# Patient Record
Sex: Male | Born: 1970 | Race: White | Hispanic: Yes | Marital: Married
Health system: Southern US, Community
[De-identification: ages and names within clinical notes are randomized; demographics above are authoritative.]

---

## 2011-03-22 ENCOUNTER — Emergency Department: Payer: Self-pay | Admitting: Emergency Medicine

## 2022-03-02 ENCOUNTER — Other Ambulatory Visit: Payer: Self-pay

## 2022-03-02 ENCOUNTER — Encounter: Payer: Self-pay | Admitting: Emergency Medicine

## 2022-03-02 ENCOUNTER — Emergency Department: Payer: Self-pay

## 2022-03-02 ENCOUNTER — Inpatient Hospital Stay
Admission: EM | Admit: 2022-03-02 | Discharge: 2022-03-05 | DRG: 030 | Disposition: A | Discharge status: Home Health | Payer: Self-pay | Attending: Osteopathic Medicine | Admitting: Osteopathic Medicine

## 2022-03-02 DIAGNOSIS — Z20822 Contact with and (suspected) exposure to covid-19: Secondary | ICD-10-CM | POA: Diagnosis present

## 2022-03-02 DIAGNOSIS — S14125A Central cord syndrome at C5 level of cervical spinal cord, initial encounter: Principal | ICD-10-CM | POA: Diagnosis present

## 2022-03-02 DIAGNOSIS — W1789XA Other fall from one level to another, initial encounter: Secondary | ICD-10-CM | POA: Diagnosis present

## 2022-03-02 DIAGNOSIS — W19XXXA Unspecified fall, initial encounter: Principal | ICD-10-CM

## 2022-03-02 DIAGNOSIS — G9520 Unspecified cord compression: Secondary | ICD-10-CM

## 2022-03-02 DIAGNOSIS — M5020 Other cervical disc displacement, unspecified cervical region: Secondary | ICD-10-CM | POA: Diagnosis present

## 2022-03-02 DIAGNOSIS — S0081XA Abrasion of other part of head, initial encounter: Secondary | ICD-10-CM | POA: Diagnosis present

## 2022-03-02 DIAGNOSIS — M5127 Other intervertebral disc displacement, lumbosacral region: Secondary | ICD-10-CM | POA: Diagnosis present

## 2022-03-02 DIAGNOSIS — M50223 Other cervical disc displacement at C6-C7 level: Secondary | ICD-10-CM | POA: Diagnosis present

## 2022-03-02 DIAGNOSIS — R531 Weakness: Secondary | ICD-10-CM

## 2022-03-02 DIAGNOSIS — M4802 Spinal stenosis, cervical region: Secondary | ICD-10-CM | POA: Diagnosis present

## 2022-03-02 DIAGNOSIS — M50222 Other cervical disc displacement at C5-C6 level: Secondary | ICD-10-CM | POA: Diagnosis present

## 2022-03-02 LAB — CBC
HCT: 39.6 % (ref 39.0–52.0)
Hemoglobin: 13.7 g/dL (ref 13.0–17.0)
MCH: 29.8 pg (ref 26.0–34.0)
MCHC: 34.6 g/dL (ref 30.0–36.0)
MCV: 86.3 fL (ref 80.0–100.0)
Platelets: 244 10*3/uL (ref 150–400)
RBC: 4.59 MIL/uL (ref 4.22–5.81)
RDW: 12.9 % (ref 11.5–15.5)
WBC: 8.4 10*3/uL (ref 4.0–10.5)
nRBC: 0 % (ref 0.0–0.2)

## 2022-03-02 LAB — RESP PANEL BY RT-PCR (FLU A&B, COVID) ARPGX2
Influenza A by PCR: NEGATIVE
Influenza B by PCR: NEGATIVE
SARS Coronavirus 2 by RT PCR: NEGATIVE

## 2022-03-02 LAB — CBG MONITORING, ED: Glucose-Capillary: 105 mg/dL — ABNORMAL HIGH (ref 70–99)

## 2022-03-02 LAB — COMPREHENSIVE METABOLIC PANEL
ALT: 17 U/L (ref 0–44)
AST: 24 U/L (ref 15–41)
Albumin: 4.4 g/dL (ref 3.5–5.0)
Alkaline Phosphatase: 72 U/L (ref 38–126)
Anion gap: 10 (ref 5–15)
BUN: 13 mg/dL (ref 6–20)
CO2: 21 mmol/L — ABNORMAL LOW (ref 22–32)
Calcium: 8.9 mg/dL (ref 8.9–10.3)
Chloride: 106 mmol/L (ref 98–111)
Creatinine, Ser: 0.61 mg/dL (ref 0.61–1.24)
GFR, Estimated: >60 mL/min (ref >60)
Glucose, Bld: 96 mg/dL (ref 70–99)
Potassium: 3.5 mmol/L (ref 3.5–5.1)
Sodium: 137 mmol/L (ref 135–145)
Total Bilirubin: 0.5 mg/dL (ref 0.3–1.2)
Total Protein: 7.8 g/dL (ref 6.5–8.1)

## 2022-03-02 LAB — DIFFERENTIAL
Abs Immature Granulocytes: 0.02 10*3/uL (ref 0.00–0.07)
Basophils Absolute: 0 10*3/uL (ref 0.0–0.1)
Basophils Relative: 0 %
Eosinophils Absolute: 0 10*3/uL (ref 0.0–0.5)
Eosinophils Relative: 0 %
Immature Granulocytes: 0 %
Lymphocytes Relative: 14 %
Lymphs Abs: 1.2 10*3/uL (ref 0.7–4.0)
Monocytes Absolute: 0.4 10*3/uL (ref 0.1–1.0)
Monocytes Relative: 5 %
Neutro Abs: 6.8 10*3/uL (ref 1.7–7.7)
Neutrophils Relative %: 81 %

## 2022-03-02 LAB — PROTIME-INR
INR: 1 (ref 0.8–1.2)
Prothrombin Time: 13.1 seconds (ref 11.4–15.2)

## 2022-03-02 LAB — GLUCOSE, CAPILLARY: Glucose-Capillary: 93 mg/dL (ref 70–99)

## 2022-03-02 LAB — APTT: aPTT: 24 seconds (ref 24–36)

## 2022-03-02 MED ORDER — PANTOPRAZOLE SODIUM 40 MG PO TBEC
40.0000 mg | DELAYED_RELEASE_TABLET | Freq: Every day | ORAL | Status: DC
  Administered 2022-03-04: 40 mg via ORAL
  Filled 2022-03-02: qty 1

## 2022-03-02 MED ORDER — GADOBUTROL 1 MMOL/ML IV SOLN
7.0000 mL | Freq: Once | INTRAVENOUS | Status: AC | PRN
  Administered 2022-03-02: 7.5 mL via INTRAVENOUS

## 2022-03-02 MED ORDER — DOCUSATE SODIUM 100 MG PO CAPS: 100.0000 mg | ORAL_CAPSULE | Freq: Two times a day (BID) | ORAL | Status: DC | PRN

## 2022-03-02 MED ORDER — POLYETHYLENE GLYCOL 3350 17 G PO PACK: 17.0000 g | PACK | Freq: Every day | ORAL | Status: DC | PRN

## 2022-03-02 MED ORDER — DEXAMETHASONE SODIUM PHOSPHATE 10 MG/ML IJ SOLN
10.0000 mg | Freq: Once | INTRAMUSCULAR | Status: DC
Start: 2022-03-02 — End: 2022-03-02

## 2022-03-02 MED ORDER — SODIUM CHLORIDE 0.9 % IV SOLN
Freq: Once | INTRAVENOUS | Status: AC
  Administered 2022-03-02: 100 mL via INTRAVENOUS

## 2022-03-02 MED ORDER — SODIUM CHLORIDE 0.9 % IV SOLN: 250.0000 mL | INTRAVENOUS | Status: DC

## 2022-03-02 MED ORDER — ACETAMINOPHEN 325 MG PO TABS: 650.0000 mg | ORAL_TABLET | ORAL | Status: DC | PRN

## 2022-03-02 MED ORDER — ONDANSETRON HCL 4 MG/2ML IJ SOLN
4.0000 mg | Freq: Once | INTRAMUSCULAR | Status: AC
  Administered 2022-03-02: 4 mg via INTRAVENOUS
  Filled 2022-03-02: qty 2

## 2022-03-02 MED ORDER — MORPHINE SULFATE (PF) 4 MG/ML IV SOLN
4.0000 mg | Freq: Once | INTRAVENOUS | Status: AC
  Administered 2022-03-02: 4 mg via INTRAVENOUS
  Filled 2022-03-02: qty 1

## 2022-03-02 MED ORDER — NOREPINEPHRINE 4 MG/250ML-% IV SOLN: 2.0000 ug/min | INTRAVENOUS | Status: DC
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
## 2022-03-02 NOTE — ED Notes (Signed)
Pt was transported to MRI 
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
## 2022-03-03 ENCOUNTER — Inpatient Hospital Stay: Payer: Self-pay

## 2022-03-03 ENCOUNTER — Inpatient Hospital Stay: Payer: Self-pay | Admitting: Certified Registered"

## 2022-03-03 ENCOUNTER — Encounter
Admission: EM | Disposition: A | Discharge status: Home Health | Payer: Self-pay | Source: Home / Self Care | Attending: Internal Medicine

## 2022-03-03 ENCOUNTER — Other Ambulatory Visit: Payer: Self-pay

## 2022-03-03 HISTORY — PX: ANTERIOR CERVICAL DECOMP/DISCECTOMY FUSION: SHX1161

## 2022-03-03 LAB — CBC
HCT: 38.3 % — ABNORMAL LOW (ref 39.0–52.0)
Hemoglobin: 13 g/dL (ref 13.0–17.0)
MCH: 30 pg (ref 26.0–34.0)
MCHC: 33.9 g/dL (ref 30.0–36.0)
MCV: 88.2 fL (ref 80.0–100.0)
Platelets: 225 10*3/uL (ref 150–400)
RBC: 4.34 MIL/uL (ref 4.22–5.81)
RDW: 12.9 % (ref 11.5–15.5)
WBC: 7.9 10*3/uL (ref 4.0–10.5)
nRBC: 0 % (ref 0.0–0.2)

## 2022-03-03 LAB — GLUCOSE, CAPILLARY
Glucose-Capillary: 76 mg/dL (ref 70–99)
Glucose-Capillary: 82 mg/dL (ref 70–99)
Glucose-Capillary: 79 mg/dL (ref 70–99)
Glucose-Capillary: 78 mg/dL (ref 70–99)
Glucose-Capillary: 99 mg/dL (ref 70–99)
Glucose-Capillary: 116 mg/dL — ABNORMAL HIGH (ref 70–99)
Glucose-Capillary: 145 mg/dL — ABNORMAL HIGH (ref 70–99)

## 2022-03-03 LAB — BASIC METABOLIC PANEL
Anion gap: 12 (ref 5–15)
BUN: 17 mg/dL (ref 6–20)
CO2: 24 mmol/L (ref 22–32)
Calcium: 8.8 mg/dL — ABNORMAL LOW (ref 8.9–10.3)
Chloride: 105 mmol/L (ref 98–111)
Creatinine, Ser: 0.78 mg/dL (ref 0.61–1.24)
GFR, Estimated: >60 mL/min (ref >60)
Glucose, Bld: 82 mg/dL (ref 70–99)
Potassium: 3.3 mmol/L — ABNORMAL LOW (ref 3.5–5.1)
Sodium: 141 mmol/L (ref 135–145)

## 2022-03-03 LAB — PHOSPHORUS: Phosphorus: 3 mg/dL (ref 2.5–4.6)

## 2022-03-03 LAB — MAGNESIUM: Magnesium: 2.2 mg/dL (ref 1.7–2.4)

## 2022-03-03 LAB — TYPE AND SCREEN
ABO/RH(D): B POS
Antibody Screen: NEGATIVE

## 2022-03-03 LAB — ABO/RH: ABO/RH(D): B POS

## 2022-03-03 LAB — HIV ANTIBODY (ROUTINE TESTING W REFLEX): HIV Screen 4th Generation wRfx: NONREACTIVE

## 2022-03-03 SURGERY — ANTERIOR CERVICAL DECOMPRESSION/DISCECTOMY FUSION 2 LEVELS
Anesthesia: General | Site: Spine Cervical | Laterality: Left

## 2022-03-03 MED ORDER — PROPOFOL 500 MG/50ML IV EMUL
INTRAVENOUS | Status: DC | PRN
  Administered 2022-03-03: 150 ug/kg/min via INTRAVENOUS

## 2022-03-03 MED ORDER — REMIFENTANIL HCL 1 MG IV SOLR
INTRAVENOUS | Status: AC
  Filled 2022-03-03: qty 1000

## 2022-03-03 MED ORDER — LIDOCAINE-EPINEPHRINE 1 %-1:100000 IJ SOLN
INTRAMUSCULAR | Status: DC | PRN
  Administered 2022-03-03: 7 mL

## 2022-03-03 MED ORDER — MIDAZOLAM HCL 2 MG/2ML IJ SOLN
INTRAMUSCULAR | Status: DC | PRN
Start: 2022-03-03 — End: 2022-03-03
  Administered 2022-03-03: 2 mg via INTRAVENOUS

## 2022-03-03 MED ORDER — ONDANSETRON HCL 4 MG/2ML IJ SOLN
INTRAMUSCULAR | Status: DC | PRN
  Administered 2022-03-03: 4 mg via INTRAVENOUS

## 2022-03-03 MED ORDER — LACTATED RINGERS IV SOLN: INTRAVENOUS | Status: DC | PRN

## 2022-03-03 MED ORDER — SUCCINYLCHOLINE CHLORIDE 200 MG/10ML IV SOSY
PREFILLED_SYRINGE | INTRAVENOUS | Status: DC | PRN
  Administered 2022-03-03: 100 mg via INTRAVENOUS

## 2022-03-03 MED ORDER — POTASSIUM CHLORIDE 10 MEQ/100ML IV SOLN
10.0000 meq | INTRAVENOUS | Status: AC
  Administered 2022-03-03 (×4): 10 meq via INTRAVENOUS
  Filled 2022-03-03 (×4): qty 100

## 2022-03-03 MED ORDER — HYDROMORPHONE HCL 1 MG/ML IJ SOLN: 0.2500 mg | INTRAMUSCULAR | Status: DC | PRN

## 2022-03-03 MED ORDER — GABAPENTIN 300 MG PO CAPS
ORAL_CAPSULE | ORAL | Status: AC
  Administered 2022-03-03: 300 mg
  Filled 2022-03-03: qty 1

## 2022-03-03 MED ORDER — GABAPENTIN 600 MG PO TABS
300.0000 mg | ORAL_TABLET | Freq: Once | ORAL | Status: DC
Start: 2022-03-03 — End: 2022-03-03
  Filled 2022-03-03: qty 0.5

## 2022-03-03 MED ORDER — PROPOFOL 1000 MG/100ML IV EMUL
INTRAVENOUS | Status: AC
  Filled 2022-03-03: qty 100

## 2022-03-03 MED ORDER — PROMETHAZINE HCL 25 MG/ML IJ SOLN: 6.2500 mg | INTRAMUSCULAR | Status: DC | PRN

## 2022-03-03 MED ORDER — DEXAMETHASONE SODIUM PHOSPHATE 10 MG/ML IJ SOLN
INTRAMUSCULAR | Status: DC | PRN
  Administered 2022-03-03: 10 mg via INTRAVENOUS

## 2022-03-03 MED ORDER — MORPHINE SULFATE (PF) 2 MG/ML IV SOLN
INTRAVENOUS | Status: AC
  Administered 2022-03-03: 2 mg via INTRAVENOUS
  Filled 2022-03-03: qty 1

## 2022-03-03 MED ORDER — MORPHINE SULFATE (PF) 2 MG/ML IV SOLN: 2.0000 mg | INTRAVENOUS | Status: DC | PRN

## 2022-03-03 MED ORDER — FENTANYL CITRATE (PF) 100 MCG/2ML IJ SOLN
INTRAMUSCULAR | Status: AC
  Filled 2022-03-03: qty 2

## 2022-03-03 MED ORDER — PHENYLEPHRINE HCL-NACL 20-0.9 MG/250ML-% IV SOLN
INTRAVENOUS | Status: DC | PRN
  Administered 2022-03-03: 30 ug/min via INTRAVENOUS

## 2022-03-03 MED ORDER — FENTANYL CITRATE (PF) 100 MCG/2ML IJ SOLN
INTRAMUSCULAR | Status: DC | PRN
  Administered 2022-03-03 (×2): 25 ug via INTRAVENOUS
  Administered 2022-03-03: 50 ug via INTRAVENOUS

## 2022-03-03 MED ORDER — ACETAMINOPHEN 500 MG PO TABS: 1000.0000 mg | ORAL_TABLET | Freq: Once | ORAL | Status: AC

## 2022-03-03 MED ORDER — MIDAZOLAM HCL 2 MG/2ML IJ SOLN
INTRAMUSCULAR | Status: AC
  Filled 2022-03-03: qty 2

## 2022-03-03 MED ORDER — POTASSIUM CHLORIDE CRYS ER 20 MEQ PO TBCR: 40.0000 meq | EXTENDED_RELEASE_TABLET | Freq: Once | ORAL | Status: DC

## 2022-03-03 MED ORDER — 0.9 % SODIUM CHLORIDE (POUR BTL) OPTIME
TOPICAL | Status: DC | PRN
  Administered 2022-03-03: 1000 mL

## 2022-03-03 MED ORDER — KETOROLAC TROMETHAMINE 30 MG/ML IJ SOLN
INTRAMUSCULAR | Status: AC
  Filled 2022-03-03: qty 1

## 2022-03-03 MED ORDER — SURGIFLO WITH THROMBIN (HEMOSTATIC MATRIX KIT) OPTIME
TOPICAL | Status: DC | PRN
  Administered 2022-03-03: 1 via TOPICAL

## 2022-03-03 MED ORDER — PROPOFOL 500 MG/50ML IV EMUL
INTRAVENOUS | Status: AC
  Filled 2022-03-03: qty 50

## 2022-03-03 MED ORDER — ACETAMINOPHEN 500 MG PO TABS
ORAL_TABLET | ORAL | Status: AC
  Administered 2022-03-03: 1000 mg via ORAL
  Filled 2022-03-03: qty 2

## 2022-03-03 MED ORDER — PROPOFOL 10 MG/ML IV BOLUS
INTRAVENOUS | Status: AC
  Filled 2022-03-03: qty 20

## 2022-03-03 MED ORDER — KETOROLAC TROMETHAMINE 30 MG/ML IJ SOLN
INTRAMUSCULAR | Status: DC | PRN
  Administered 2022-03-03: 30 mg via INTRAVENOUS

## 2022-03-03 MED ORDER — SODIUM CHLORIDE 0.9 % IV SOLN: INTRAVENOUS | Status: AC

## 2022-03-03 MED ORDER — CEFAZOLIN SODIUM-DEXTROSE 2-3 GM-%(50ML) IV SOLR
INTRAVENOUS | Status: DC | PRN
Start: 2022-03-03 — End: 2022-03-03
  Administered 2022-03-03: 2 g via INTRAVENOUS

## 2022-03-03 MED ORDER — PROPOFOL 10 MG/ML IV BOLUS
INTRAVENOUS | Status: DC | PRN
  Administered 2022-03-03: 150 mg via INTRAVENOUS
  Administered 2022-03-03: 50 mg via INTRAVENOUS

## 2022-03-03 MED ORDER — FENTANYL CITRATE (PF) 100 MCG/2ML IJ SOLN
INTRAMUSCULAR | Status: AC
  Administered 2022-03-03: 50 ug
  Filled 2022-03-03: qty 2

## 2022-03-03 MED ORDER — LIDOCAINE HCL (CARDIAC) PF 100 MG/5ML IV SOSY
PREFILLED_SYRINGE | INTRAVENOUS | Status: DC | PRN
  Administered 2022-03-03: 100 mg via INTRAVENOUS

## 2022-03-03 MED ORDER — REMIFENTANIL HCL 1 MG IV SOLR
INTRAVENOUS | Status: DC | PRN
  Administered 2022-03-03: .1 ug/kg/min via INTRAVENOUS

## 2022-03-03 MED ORDER — DROPERIDOL 2.5 MG/ML IJ SOLN: 0.6250 mg | Freq: Once | INTRAMUSCULAR | Status: DC | PRN

## 2022-03-03 SURGICAL SUPPLY — 70 items
BIT DRILL 13 (BIT) IMPLANT
BIT DRILL SPINE QC 12 (BIT) ×1 IMPLANT
BLADE BOVIE TIP EXT 4 (BLADE) ×2 IMPLANT
BLADE SURG 15 STRL LF DISP TIS (BLADE) ×1 IMPLANT
BLADE SURG 15 STRL SS (BLADE) ×1
BUR DIAMOND COARSE 4.0 RND (BURR) IMPLANT
BUR NEURO DRILL SOFT 3.0X3.8M (BURR) ×2 IMPLANT
CHLORAPREP W/TINT 26 (MISCELLANEOUS) ×4 IMPLANT
COUNTER NEEDLE 20/40 LG (NEEDLE) ×2 IMPLANT
COVER LIGHT HANDLE STERIS (MISCELLANEOUS) ×4 IMPLANT
CUP MEDICINE 2OZ PLAST GRAD ST (MISCELLANEOUS) ×4 IMPLANT
DERMABOND ADVANCED (GAUZE/BANDAGES/DRESSINGS) ×1
DERMABOND ADVANCED .7 DNX12 (GAUZE/BANDAGES/DRESSINGS) ×1 IMPLANT
DRAPE C-ARM 42X72 X-RAY (DRAPES) ×4 IMPLANT
DRAPE C-ARM XRAY 36X54 (DRAPES) ×1 IMPLANT
DRAPE MICROSCOPE SPINE 48X150 (DRAPES) ×2 IMPLANT
DRAPE THYROID T SHEET (DRAPES) ×2 IMPLANT
ELECT CAUTERY BLADE TIP 2.5 (TIP) ×2
ELECT EZSTD 165MM 6.5IN (MISCELLANEOUS) ×2
ELECTRODE CAUTERY BLDE TIP 2.5 (TIP) ×1 IMPLANT
ELECTRODE EZSTD 165MM 6.5IN (MISCELLANEOUS) ×1 IMPLANT
FEE INTRAOP CADWELL SUPPLY NCS (MISCELLANEOUS) ×1 IMPLANT
FEE INTRAOP MONITOR IMPULS NCS (MISCELLANEOUS) IMPLANT
GAUZE 4X4 16PLY ~~LOC~~+RFID DBL (SPONGE) ×2 IMPLANT
GAUZE SPONGE 4X4 12PLY STRL (GAUZE/BANDAGES/DRESSINGS) ×2 IMPLANT
GLOVE BIOGEL PI IND STRL 6.5 (GLOVE) ×2 IMPLANT
GLOVE BIOGEL PI IND STRL 8 (GLOVE) ×1 IMPLANT
GLOVE BIOGEL PI INDICATOR 6.5 (GLOVE) ×2
GLOVE BIOGEL PI INDICATOR 8 (GLOVE) ×1
GLOVE SURG SYN 6.5 ES PF (GLOVE) ×4 IMPLANT
GLOVE SURG SYN 6.5 PF PI (GLOVE) ×2 IMPLANT
GLOVE SURG SYN 8.0 (GLOVE) ×4 IMPLANT
GLOVE SURG SYN 8.0 PF PI (GLOVE) ×2 IMPLANT
GOWN SRG LRG LVL 4 IMPRV REINF (GOWNS) ×2 IMPLANT
GOWN STRL REIN LRG LVL4 (GOWNS) ×2
GOWN STRL REUS W/ TWL XL LVL3 (GOWN DISPOSABLE) ×2 IMPLANT
GOWN STRL REUS W/TWL XL LVL3 (GOWN DISPOSABLE) ×2
GRADUATE 1200CC STRL 31836 (MISCELLANEOUS) ×2 IMPLANT
INTRAOP CADWELL SUPPLY FEE NCS (MISCELLANEOUS) ×1
INTRAOP DISP SUPPLY FEE NCS (MISCELLANEOUS) ×1
INTRAOP MONITOR FEE IMPULS NCS (MISCELLANEOUS)
INTRAOP MONITOR FEE IMPULSE (MISCELLANEOUS)
KIT TURNOVER KIT A (KITS) ×2 IMPLANT
MANIFOLD NEPTUNE II (INSTRUMENTS) ×2 IMPLANT
MARKER SKIN DUAL TIP RULER LAB (MISCELLANEOUS) ×4 IMPLANT
NDL SPNL 22GX3.5 QUINCKE BK (NEEDLE) ×1 IMPLANT
NEEDLE HYPO 22GX1.5 SAFETY (NEEDLE) ×2 IMPLANT
NEEDLE SPNL 22GX3.5 QUINCKE BK (NEEDLE) ×2 IMPLANT
NS IRRIG 1000ML POUR BTL (IV SOLUTION) ×2 IMPLANT
PACK LAMINECTOMY NEURO (CUSTOM PROCEDURE TRAY) ×2 IMPLANT
PAD ARMBOARD 7.5X6 YLW CONV (MISCELLANEOUS) ×4 IMPLANT
PIN CASPAR 14 (PIN) ×1 IMPLANT
PIN CASPAR 14MM (PIN) ×2 IMPLANT
PLATE ANT CERV XTEND 2 LV 30 (Plate) ×1 IMPLANT
SCREW VAR 4.2 XD SELF DRILL 16 (Screw) ×4 IMPLANT
SCREW XTEND SELF DRILL 4.6X16 (Screw) ×2 IMPLANT
SPACER CERV FRGE 12X14X9-7 (Spacer) ×1 IMPLANT
SPACER CERVICAL FRGE 12X14X7-7 (Spacer) ×1 IMPLANT
SPACER CERVICAL FRGE 12X14X8-0 (Spacer) ×1 IMPLANT
SPONGE KITTNER 5P (MISCELLANEOUS) ×2 IMPLANT
SURGIFLO W/THROMBIN 8M KIT (HEMOSTASIS) ×2 IMPLANT
SUT POLYSORB 2-0 5X18 GS-10 (SUTURE) ×4 IMPLANT
SUT VICRYL 3-0 CR8 SH (SUTURE) ×2 IMPLANT
SYR 20ML LL LF (SYRINGE) ×1 IMPLANT
SYR 30ML LL (SYRINGE) ×2 IMPLANT
TAPE CLOTH 3X10 WHT NS LF (GAUZE/BANDAGES/DRESSINGS) ×2 IMPLANT
TOWEL OR 17X26 4PK STRL BLUE (TOWEL DISPOSABLE) ×4 IMPLANT
TRAY FOLEY MTR SLVR 16FR STAT (SET/KITS/TRAYS/PACK) IMPLANT
TUBING CONNECTING 10 (TUBING) ×2 IMPLANT
WATER STERILE IRR 500ML POUR (IV SOLUTION) ×2 IMPLANT
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
## 2022-03-03 NOTE — Transfer of Care (Signed)
Immediate Anesthesia Transfer of Care Note ? ?Patient: Timothy Wright ? ?Procedure(s) Performed: ANTERIOR CERVICAL DECOMPRESSION/DISCECTOMY FUSION 2 LEVELS (Left: Spine Cervical) ? ?Patient Location: PACU ? ?Anesthesia Type:General ? ?Level of Consciousness: awake, alert , oriented, patient cooperative and responds to stimulation ? ?Airway & Oxygen Therapy: Patient Spontanous Breathing and Patient connected to nasal cannula oxygen ? ?Post-op Assessment: Report given to RN, Post -op Vital signs reviewed and stable and Patient moving all extremities X 4 ? ?Post vital signs: stable ? ?Last Vitals:  ?Vitals Value Taken Time  ?BP 152/89 03/03/22 1706  ?Temp 98.25F   ?Pulse 96 03/03/22 1712  ?Resp 17 03/03/22 1712  ?SpO2 100 % 03/03/22 1712  ?Vitals shown include unvalidated device data. ? ?Last Pain:  ?Vitals:  ? 03/03/22 0800  ?TempSrc: Oral  ?PainSc: 0-No pain  ?   ? ?  ? ?Complications: No notable events documented. ?

## 2022-03-03 NOTE — Anesthesia Procedure Notes (Signed)
Arterial Line Insertion ?Start/End5/14/2023 2:07 PM, 03/03/2022 2:07 PM ?Performed by: Foye Deer, MD, anesthesiologist ? Patient location: OR. ?Preanesthetic checklist: patient identified, IV checked, site marked, risks and benefits discussed, surgical consent, monitors and equipment checked, pre-op evaluation, timeout performed and anesthesia consent ?Lidocaine 1% used for infiltration ?Right, radial was placed ?Catheter size: 20 G ?Hand hygiene performed  and maximum sterile barriers used  ? ?Attempts: 2 ?Procedure performed using ultrasound guided technique. ?Ultrasound Notes:anatomy identified and no ultrasound evidence of intravascular and/or intraneural injection ?Following insertion, dressing applied. ?Post procedure assessment: normal and unchanged ? ?Patient tolerated the procedure well with no immediate complications. ? ? ?

## 2022-03-03 NOTE — Anesthesia Preprocedure Evaluation (Addendum)
Anesthesia Evaluation  ?Patient identified by MRN, date of birth, ID band ?Patient awake ? ? ? ?Reviewed: ?Allergy & Precautions, NPO status , Patient's Chart, lab work & pertinent test results ? ?Airway ?Mallampati: III ? ?TM Distance: >3 FB ?Neck ROM: full ? ? ?Comment: Cervical collar in place Dental ? ?(+) Chipped ?  ?Pulmonary ?neg pulmonary ROS,  ?  ?Pulmonary exam normal ? ? ? ? ? ? ? Cardiovascular ?negative cardio ROS ?Normal cardiovascular exam ? ? ?  ?Neuro/Psych ?negative psych ROS  ? GI/Hepatic ?negative GI ROS, (+)  ?  ? substance abuse (6.0 standard drinks per week) ? alcohol use,   ?Endo/Other  ?negative endocrine ROS ? Renal/GU ?  ? ?  ?Musculoskeletal ? ? Abdominal ?Normal abdominal exam  (+)   ?Peds ? 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 ?S/p fall off a truck and striking his face with right arm and leg weakness. ? ?Admitted to ICU with strict BP control maintaining MAP > 85 using vasopressors PRN. ? ?Medications given: morphine IV, zofran, IV contrast ?Initial Vitals: 99.8, 18, 96, 138/89 & 94% on RA ?EKG Interpretation: Date:?03/02/22, EKG?Time:?10:19, Rate:?95, Rhythm: NSR, QRS Axis:? normal, Intervals: normal, ST/T Wave abnormalities: none, Narrative Interpretation: NSR ?Blood work WNL except serum CO2 which is slightly acidotic at 21 ?? ?CT head wo contrast 03/02/22: no acute intracranial abnormality ?CT Maxillofacial & cervical spine 03/02/22: Maxillofacial CT: Negative. ?No fracture. CERVICAL CT: No fracture or acute finding. ?MRI cervical spine w wo contrast 03/02/22:?Broad-based central disc protrusion at C5-6 with resultant severe spinal stenosis and cord flattening. Associated cord signal changes consistent with acute cord injury/contusion. Mild edema within the interspinous region of C4-5 through C6-7, suspected to reflect mild ligamentous injury/strain. Mild soft tissue  injury/muscular strain within the suboccipital soft tissues. Additional underlying mild multilevel cervical spondylosis as ?above. ?MRI lumbar spine w wo contrast 03/02/22: No acute abnormality within the lumbar spine. Small left subarticular disc protrusion at L5-S1, contacting and potentially irritating the descending left S1 nerve root. Mild disc bulge with facet hypertrophy at L4-5 with resultant mild bilateral lateral recess and foraminal stenosis. Prominent distension of the visualized urinary bladder. Clinical correlation for possible urinary retention recommended.  ?MRI thoracic spine w wo contrast 03/02/22: No acute traumatic injury or other abnormality identified within the thoracic spine or spinal cord. Small central disc protrusions at T2-3 and T3-4 with minimal cord ?flattening, but no significant stenosis.  ? ?BMI   ? Body Mass Index: 30.24 kg/m?  ?  ? ? Reproductive/Obstetrics ?negative OB ROS ? ?  ? ? ? ? ? ? ? ? ? ? ? ? ? ?  ?  ? ? ? ? ? ? ?Anesthesia Physical ?Anesthesia Plan ? ?ASA: 2 ? ?Anesthesia Plan: General ETT  ? ?Post-op Pain Management: Tylenol PO (pre-op)*, Gabapentin PO (pre-op)* and Toradol IV (intra-op)*  ? ?Induction: Intravenous ? ?PONV Risk Score and Plan: 2 and Ondansetron, Dexamethasone, Midazolam and Treatment may vary due to age or medical condition ? ?Airway Management Planned: Oral ETT ? ?Additional Equipment:  ? ?Intra-op Plan:  ? ?Post-operative Plan: Extubation in OR ? ?Informed Consent: I have reviewed the patients History and Physical, chart, labs and discussed the procedure including the risks, benefits and alternatives for the proposed anesthesia with the patient or authorized representative who has indicated his/her understanding and acceptance.  ? ? ? ?Dental Advisory Given ? ?Plan Discussed with: Anesthesiologist, CRNA and Surgeon ? ?  Anesthesia Plan Comments:   ? ? ? ?Anesthesia Quick Evaluation ? ?

## 2022-03-03 NOTE — Op Note (Signed)
Operative Note ?  ?SURGERY DATE:  03/03/2022 ?  ?PRE-OP DIAGNOSIS:  Cervical Myelopathy ?  ?POST-OP DIAGNOSIS: Post-Op Diagnosis Codes: ?Cervical Myelopathy ?  ?Procedure(s) with comments: ?C4/5, C5/6 Anterior Cervical Discectomy and Fusion ? ?SURGEON:  ?   Nathaniel Man, MD  ?   ?  ?ANESTHESIA: General  ?  ?OPERATIVE FINDINGS: C4/5, C5/6 Stenosis ?  ?Procedure ?Indications ?Timothy Wright presented to the ED with weakness in hands and feet after a fall.  He had a MRI that showed disc herniations causing central stenosis at C4/5 and C5/6 and cord compression. Given this, we recommended an anterior cervical decompression and fusion to relieve the pressure on the spinal cord. The risks of hematoma, infection, poor bone healing and failure of fusion, cord injury, weakness, numbness, neck pain, stroke, and death were discussed in detail. All questions were answered and the patient elected to proceed with the surgery.  ?   ?Procedure ?After obtaining informed consent, the patient was taken to the Operating Room where general anesthesia was induced and the patient intubated. Vascular access was obtained. Foley catheter was placed. Decadron was administered. The head was slightly extended and imaging used to identify a skin crease overlying the C5 vertebral body.  ?   ?The patient was prepped and draped in the usual sterile fashion and a timeout was performed per protocol. Local anesthesia was instilled with epinephrine along the planned incision site. A transverse cervical incision was performed on the left in a skin crease. The incision was carried to the level of the platysma and then cautery was used to incise the muscle. Blunt dissection was used to expand the plane and the dissection was carried deep medial to the SCM and carotid sheath being careful to identify the trachea and esophagus medially. The prevertebral fascia was identified and this was bluntly dissected to expose the disc spaces. A needle was  placed in the disc space and x-ray confirmed the C5/6 disc level.  ?   ?Next, cautery was used to undermine the longus colli muscles bilaterally and identify the C4/5 and C5/6 disc spaces. Caspar pins were placed at C5 and C6 and a retractor system placed under the muscles to complete the exposure. Next, a combination of curettes and Kerrison rongeurs were used to remove the anterior osteophyte at C5/6 and then the disc material. A 51mm matchstick was used to shave the endplates of the adjacent bodies. A trial spacer was used to size the graft and then hemostasis obtained. The microscope was brought into the field for the remainder of the surgery. The drill was used to remove the osteophyte/disc complex deep to the level of the PLL at C5/6. There was significant disc protrusion at this level that was removed with rongeurs. The PLL was entered with a hook and then the PLL was removed along with remaining disc material to decompress centrally and then out into bilateral neuro foramen. A blunt probe was used to confirm no residual stenosis laterally and a curette used to ensure no posterior osteophyte remained.  Floseal was used for hemostasis. Allograft was placed, 42mm in height and placed slightly recessed to the anterior edge of vertebral body ?  ?The caspar pin was moved from C6 to C4. The disc space was entered there sharply and the disc material removed in a similar fashion at the C4/5 level. The endplates were prepared using the hgh speed drill and then the posterior disc/osteophyte complex also removed in similar fashion.  A trial  spacer was used to size the graft. The PLL was entered with a hook and then the PLL was removed along with remaining disc material to decompress centrally and then out into bilateral neuro foramen. A blunt probe was used to confirm no residual stenosis laterally and a curette used to ensure no posterior osteophyte remained. Floseal was used for hemostasis.  Allograft was placed, 8mm in  height and placed slightly recessed to the anterior edge of vertebral body ?  ?X-ray confirmed good placement of grafts.  The caspar pins were removed and bone wax placed. The remainder of the osteophytes were drilled to allow for plating. Next, a 30mm plate was found to be the adequate size and was placed in the midline and secured with two 16mm screws at each bone level. Xray was obtained confirming good graft placement and adequate depth of screws. The retractors were removed. The wound was irrigated copiously and hemostasis obtained. The platysma was closed with 2-0 vicryl suture. The dermis was closed with 3-0 Vicryl and Dermabond was placed on the skin. ?   ?The patient had general anesthesia reversed and was extubated following the procedure. He awoke following commands with symmetric movement. He was taken to the PACU where he continued his recovery and then the ICU ? ? ? ?ESTIMATED BLOOD LOSS:  ? 20 cc ?  ?SPECIMENS ?None ?  ?IMPLANT ?SPACER CERV FRGE [id number] - [id number] ? ?Inventory Item: SPACER CERV FRGE [id number] Serial no.:  Model/Cat no.: [id number]  ?Implant name: SPACER CERV FRGE [id number] - [id number] Laterality: Left Area: Spine Cervical  ?Manufacturer: [employer] Date of Manufacture:    ?Action: Implanted Number Used: 1   ?Device Identifier:  Device Identifier Type:    ? ?SPACER CERVICAL FRGE [id number] - [id number] ? ?Inventory Item: SPACER CERVICAL FRGE [id number] Serial no.:  Model/Cat no.: [id number]  ?Implant name: SPACER CERVICAL FRGE [id number] - [id number] Laterality: Left Area: Spine Cervical  ?Manufacturer: [employer] Date of Manufacture:    ?Action: Implanted and Explanted Number Used: 1   ?Device Identifier:  Device Identifier Type:    ? ?SPACER CERVICAL FRGE [id number] - [id number] ? ?Inventory Item: SPACER CERVICAL FRGE [id number] Serial no.:  Model/Cat no.: [id number]  ?Implant name: SPACER CERVICAL FRGE [id number] - [id number] Laterality: Left Area: Spine Cervical  ?Manufacturer: [employer] Date of Manufacture:    ?Action: Implanted Number Used: 1   ?Device Identifier:  Device Identifier Type:    ? ?SCREW XTEND SELF DRILL 4.6X16 - [id number] ? ?Inventory Item: SCREW XTEND SELF DRILL 4.6X16 Serial no.:  Model/Cat no.: [id number]  ?Implant name: Merry ProudSCREW XTEND SELF DRILL 2.8B154.6X16 - [id number] Laterality: Left Area: Spine Cervical  ?Manufacturer: [employer] Date of Manufacture:    ?Action: Implanted Number Used: 2   ?Device Identifier:  Device Identifier Type:    ? ?SCREW VAR 4.2 XD SELF DRILL 16 - [id number] ? ?Inventory Item: SCREW VAR 4.2 XD SELF DRILL 16 Serial no.:  Model/Cat no.: [id number]  ?Implant name: SCREW VAR 4.2 XD SELF DRILL 16 - [id number] Laterality: Left Area: Spine Cervical  ?Manufacturer: [employer] Date of Manufacture:    ?Action: Implanted Number Used: 4   ?Device Identifier:  Device Identifier Type:    ? ?PLATE ANT CERV XTEND 2 LV 30 - [id number] ? ?Inventory Item: PLATE ANT CERV XTEND 2 LV 30 Serial no.:  Model/Cat no.: [id number]  ?Implant name: PLATE ANT CERV XTEND 2 LV 30 - [id number] Laterality: Left Area: Spine Cervical  ?Manufacturer: [employer] Date of Manufacture:    ?  Action: Implanted Number Used: 1   ?Device Identifier:  Device Identifier Type:    ? ?  ?  ?I performed the case in its entirety  ?  ?Lucy Chris, MD ?[phone] ? ? ?  ?

## 2022-03-03 NOTE — Anesthesia Procedure Notes (Signed)
Procedure Name: Intubation ?Date/Time: 03/03/2022 1:53 PM ?Performed by: Loree Fee, CRNA ?Pre-anesthesia Checklist: Patient identified, Patient being monitored, Timeout performed, Emergency Drugs available and Suction available ?Patient Re-evaluated:Patient Re-evaluated prior to induction ?Oxygen Delivery Method: Circle system utilized ?Preoxygenation: Pre-oxygenation with 100% oxygen ?Induction Type: IV induction ?Ventilation: Mask ventilation without difficulty ?Laryngoscope Size: Mac and 4 ?Grade View: Grade IV ?Tube type: Oral ?Tube size: 7.5 mm ?Number of attempts: 1 ?Airway Equipment and Method: Stylet ?Placement Confirmation: ETT inserted through vocal cords under direct vision, positive ETCO2 and breath sounds checked- equal and bilateral ?Secured at: 22 cm ?Tube secured with: Tape ?Dental Injury: Teeth and Oropharynx as per pre-operative assessment  ?Comments: C-spine maintained throughout induction and intubation.  RSI. ? ? ? ? ?

## 2022-03-03 NOTE — Progress Notes (Signed)
Per Dr. Adriana Simas, pt's [facility] elevated at 30 degrees or more. ?

## 2022-03-03 NOTE — Plan of Care (Signed)

## 2022-03-03 NOTE — Interval H&P Note (Signed)
History and Physical Interval Note: ?I have spoken with the patient about surgery again and he would like to proceed. We will continue with plan  ? ? ? ?03/03/2022 ?10:22 AM ? 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  The various methods of treatment have been discussed with the patient and family. After consideration of risks, benefits and other options for treatment, the patient has consented to  Procedure(s) with comments: ?ANTERIOR CERVICAL DECOMPRESSION/DISCECTOMY FUSION 2 LEVELS (Left) - C4/5, C5/6 as a surgical intervention.  The patient's history has been reviewed, patient examined, no change in status, stable for surgery.  I have reviewed the patient's chart and labs.  Questions were answered to the patient's satisfaction.   ? ? ?Timothy Wright ? ? ?

## 2022-03-03 NOTE — H&P (Signed)
Timothy Wright is an 51 y.o. male.   ?Chief Complaint: Weakness ?HPI: Timothy Wright is here for evaluation of right arm and leg weakness that he says started last night. He had a fall and afterwards noted the right hand and foot were weak. He states there was some numbness but currently that has resolved. He denies any radiating pain. He denies any left side symptoms. Given this, a MRI of the cervical spine was obtained that showed stenosis at C4/5 and C5/6 with compression of spinal cord and edema. We are consulted for evaluation ? ?History reviewed. No pertinent past medical history. ? ?History reviewed. No pertinent surgical history. ? ? ?Social History:  reports that he does not have a smoking history on file. He has never used smokeless tobacco. He reports current alcohol use of about 6.0 standard drinks per week. He reports that he does not use drugs. ? ?Allergies: No Known Allergies ? ?Medications Prior to Admission  ?Medication Sig Dispense Refill  ? acetaminophen (TYLENOL) 325 MG tablet Take 325 mg by mouth every 6 (six) hours as needed.    ? ? ?Results for orders placed or performed during the hospital encounter of 03/02/22 (from the past 48 hour(s))  ?CBG monitoring, ED     Status: Abnormal  ? Collection Time: 03/02/22 10:17 AM  ?Result Value Ref Range  ? Glucose-Capillary 105 (H) 70 - 99 mg/dL  ?  Comment: Glucose reference range applies only to samples taken after fasting for at least 8 hours.  ?Protime-INR     Status: None  ? Collection Time: 03/02/22 10:24 AM  ?Result Value Ref Range  ? Prothrombin Time 13.1 11.4 - 15.2 seconds  ? INR 1.0 0.8 - 1.2  ?  Comment: (NOTE) ?INR goal varies based on device and disease states. ?Performed at [facility], [address] [place], ?[place] [address] ?  ?APTT     Status: None  ? Collection Time: 03/02/22 10:24 AM  ?Result Value Ref Range  ? aPTT 24 24 - 36 seconds  ?  Comment: Performed at [facility], [address]  [place], [place] [address]  ?CBC     Status: None  ? Collection Time: 03/02/22 10:24 AM  ?Result Value Ref Range  ? WBC 8.4 4.0 - 10.5 K/uL  ? RBC 4.59 4.22 - 5.81 MIL/uL  ? Hemoglobin 13.7 13.0 - 17.0 g/dL  ? HCT 39.6 39.0 - 52.0 %  ? MCV 86.3 80.0 - 100.0 fL  ? MCH 29.8 26.0 - 34.0 pg  ? MCHC 34.6 30.0 - 36.0 g/dL  ? RDW 12.9 11.5 - 15.5 %  ? Platelets 244 150 - 400 K/uL  ? nRBC 0.0 0.0 - 0.2 %  ?  Comment: Performed at [facility], [address] [place], [place] [address]  ?Differential     Status: None  ? Collection Time: 03/02/22 10:24 AM  ?Result Value Ref Range  ? Neutrophils Relative % 81 %  ? Neutro Abs 6.8 1.7 - 7.7 K/uL  ? Lymphocytes Relative 14 %  ? Lymphs Abs 1.2 0.7 - 4.0 K/uL  ? Monocytes Relative 5 %  ? Monocytes Absolute 0.4 0.1 - 1.0 K/uL  ? Eosinophils Relative 0 %  ? Eosinophils Absolute 0.0 0.0 - 0.5 K/uL  ? Basophils Relative 0 %  ? Basophils Absolute 0.0 0.0 - 0.1 K/uL  ? Immature Granulocytes 0 %  ? Abs Immature Granulocytes 0.02 0.00 - 0.07 K/uL  ?  Comment:  Performed at [facility], [address], [place], [place] [address]  ?Comprehensive metabolic panel     Status: Abnormal  ? Collection Time: 03/02/22 10:24 AM  ?Result Value Ref Range  ? Sodium 137 135 - 145 mmol/L  ? Potassium 3.5 3.5 - 5.1 mmol/L  ? Chloride 106 98 - 111 mmol/L  ? CO2 21 (L) 22 - 32 mmol/L  ? Glucose, Bld 96 70 - 99 mg/dL  ?  Comment: Glucose reference range applies only to samples taken after fasting for at least 8 hours.  ? BUN 13 6 - 20 mg/dL  ? Creatinine, Ser 0.61 0.61 - 1.24 mg/dL  ? Calcium 8.9 8.9 - 10.3 mg/dL  ? Total Protein 7.8 6.5 - 8.1 g/dL  ? Albumin 4.4 3.5 - 5.0 g/dL  ? AST 24 15 - 41 U/L  ? ALT 17 0 - 44 U/L  ? Alkaline Phosphatase 72 38 - 126 U/L  ? Total Bilirubin 0.5 0.3 - 1.2 mg/dL  ? GFR, Estimated >60 >60 mL/min  ?  Comment: (NOTE) ?Calculated using the CKD-EPI Creatinine Equation (2021) ?  ? Anion gap 10 5 - 15  ?  Comment: Performed at [facility], [address], [place], [place] [address]  ?Resp Panel by RT-PCR (Flu A&B, Covid) Nasopharyngeal Swab     Status: None  ? Collection Time: 03/02/22  9:56 PM  ? Specimen: Nasopharyngeal Swab; Nasopharyngeal(NP) swabs in vial transport medium  ?Result Value Ref Range  ? SARS Coronavirus 2 by RT PCR NEGATIVE NEGATIVE  ?  Comment: (NOTE) ?SARS-CoV-2 target nucleic acids are NOT DETECTED. ? ?The SARS-CoV-2 RNA is generally detectable in upper respiratory ?specimens during the acute phase of infection. The lowest ?concentration of SARS-CoV-2 viral copies this assay can detect is ?138 copies/mL. A negative result does not preclude SARS-Cov-2 ?infection and should not be used as the sole basis for treatment or ?other patient management decisions. A negative result may occur with  ?improper specimen collection/handling, submission of specimen other ?than nasopharyngeal swab, presence of viral mutation(s) within the ?areas targeted by this assay, and inadequate number of viral ?copies(<138 copies/mL). A negative result must be combined with ?clinical observations, patient history, and epidemiological ?information. The expected result is Negative. ? ?Fact Sheet for Patients:  ?BloggerCourse.com ? ?Fact Sheet for Healthcare Providers:  ?SeriousBroker.it ? ?This test is no t yet approved or cleared by the [place] FDA and  ?has been authorized for detection and/or diagnosis of SARS-CoV-2 by ?FDA under an Emergency Use Authorization (EUA). This EUA will remain  ?in effect (meaning this test can be used) for the duration of the ?COVID-19 declaration under Section 564(b)(1) of the Act, 21 ?U.S.C.section 360bbb-3(b)(1), unless the authorization is terminated  ?or revoked sooner.  ? ? ?  ? Influenza A by PCR NEGATIVE NEGATIVE  ? Influenza B by PCR NEGATIVE NEGATIVE  ?  Comment: (NOTE) ?The Xpert Xpress SARS-CoV-2/FLU/RSV plus assay is intended as an aid ?in the diagnosis of influenza from  Nasopharyngeal swab specimens and ?should not be used as a sole basis for treatment. Nasal washings and ?aspirates are unacceptable for Xpert Xpress SARS-CoV-2/FLU/RSV ?testing. ? ?Fact Sheet for Patients: ?BloggerCourse.com ? ?Fact Sheet for Healthcare Providers: ?SeriousBroker.it ? ?This test is not yet approved or cleared by the [place] FDA and ?has been authorized for detection and/or diagnosis of SARS-CoV-2 by ?FDA under an Emergency Use Authorization (EUA). This EUA will remain ?in effect (meaning this test can be used) for  the duration of the ?COVID-19 declaration under Section 564(b)(1) of the Act, 21 U.S.C. ?section 360bbb-3(b)(1), unless the authorization is terminated or ?revoked. ? ?Performed at [facility], [address] [place], ?[place] [address] ?  ?Glucose, capillary     Status: None  ? Collection Time: 03/02/22 11:02 PM  ?Result Value Ref Range  ? Glucose-Capillary 93 70 - 99 mg/dL  ?  Comment: Glucose reference range applies only to samples taken after fasting for at least 8 hours.  ? Comment 1 Notify RN   ? Comment 2 Document in Chart   ?Glucose, capillary     Status: None  ? Collection Time: 03/03/22  3:13 AM  ?Result Value Ref Range  ? Glucose-Capillary 76 70 - 99 mg/dL  ?  Comment: Glucose reference range applies only to samples taken after fasting for at least 8 hours.  ? Comment 1 Notify RN   ? Comment 2 Document in Chart   ?CBC     Status: Abnormal  ? Collection Time: 03/03/22  4:51 AM  ?Result Value Ref Range  ? WBC 7.9 4.0 - 10.5 K/uL  ? RBC 4.34 4.22 - 5.81 MIL/uL  ? Hemoglobin 13.0 13.0 - 17.0 g/dL  ? HCT 38.3 (L) 39.0 - 52.0 %  ? MCV 88.2 80.0 - 100.0 fL  ? MCH 30.0 26.0 - 34.0 pg  ? MCHC 33.9 30.0 - 36.0 g/dL  ? RDW 12.9 11.5 - 15.5 %  ? Platelets 225 150 - 400 K/uL  ? nRBC 0.0 0.0 - 0.2 %  ?  Comment: Performed at [facility], [address], [place], [place] [address]  ?Magnesium     Status: None  ?  Collection Time: 03/03/22  4:51 AM  ?Result Value Ref Range  ? Magnesium 2.2 1.7 - 2.4 mg/dL  ?  Comment: Performed at [facility], [address] [place], [place] [address]  ?Phosphorus     Status

## 2022-03-04 ENCOUNTER — Encounter: Payer: Self-pay | Admitting: Neurosurgery

## 2022-03-04 LAB — GLUCOSE, CAPILLARY
Glucose-Capillary: 110 mg/dL — ABNORMAL HIGH (ref 70–99)
Glucose-Capillary: 112 mg/dL — ABNORMAL HIGH (ref 70–99)
Glucose-Capillary: 128 mg/dL — ABNORMAL HIGH (ref 70–99)
Glucose-Capillary: 134 mg/dL — ABNORMAL HIGH (ref 70–99)
Glucose-Capillary: 151 mg/dL — ABNORMAL HIGH (ref 70–99)
Glucose-Capillary: 239 mg/dL — ABNORMAL HIGH (ref 70–99)
Glucose-Capillary: 331 mg/dL — ABNORMAL HIGH (ref 70–99)

## 2022-03-04 LAB — BASIC METABOLIC PANEL
Anion gap: 8 (ref 5–15)
BUN: 14 mg/dL (ref 6–20)
CO2: 24 mmol/L (ref 22–32)
Calcium: 8.8 mg/dL — ABNORMAL LOW (ref 8.9–10.3)
Chloride: 106 mmol/L (ref 98–111)
Creatinine, Ser: 0.63 mg/dL (ref 0.61–1.24)
GFR, Estimated: >60 mL/min (ref >60)
Glucose, Bld: 114 mg/dL — ABNORMAL HIGH (ref 70–99)
Potassium: 3.7 mmol/L (ref 3.5–5.1)
Sodium: 138 mmol/L (ref 135–145)

## 2022-03-04 LAB — MAGNESIUM: Magnesium: 2.2 mg/dL (ref 1.7–2.4)

## 2022-03-04 LAB — CBC
HCT: 37.9 % — ABNORMAL LOW (ref 39.0–52.0)
Hemoglobin: 13.1 g/dL (ref 13.0–17.0)
MCH: 30.2 pg (ref 26.0–34.0)
MCHC: 34.6 g/dL (ref 30.0–36.0)
MCV: 87.3 fL (ref 80.0–100.0)
Platelets: 233 10*3/uL (ref 150–400)
RBC: 4.34 MIL/uL (ref 4.22–5.81)
RDW: 12.5 % (ref 11.5–15.5)
WBC: 10.7 10*3/uL — ABNORMAL HIGH (ref 4.0–10.5)
nRBC: 0 % (ref 0.0–0.2)

## 2022-03-04 LAB — PHOSPHORUS: Phosphorus: 3.6 mg/dL (ref 2.5–4.6)

## 2022-03-04 MED ORDER — ENOXAPARIN SODIUM 40 MG/0.4ML IJ SOSY
40.0000 mg | PREFILLED_SYRINGE | INTRAMUSCULAR | Status: DC
  Administered 2022-03-04: 40 mg via SUBCUTANEOUS
  Filled 2022-03-04: qty 0.4

## 2022-03-04 MED ORDER — METHOCARBAMOL 500 MG PO TABS
500.0000 mg | ORAL_TABLET | Freq: Four times a day (QID) | ORAL | Status: DC | PRN
  Filled 2022-03-04: qty 1

## 2022-03-04 MED ORDER — CHLORHEXIDINE GLUCONATE CLOTH 2 % EX PADS
6.0000 | MEDICATED_PAD | Freq: Every day | CUTANEOUS | Status: DC
  Administered 2022-03-05 (×2): 6 via TOPICAL

## 2022-03-04 MED ORDER — CELECOXIB 200 MG PO CAPS
200.0000 mg | ORAL_CAPSULE | Freq: Two times a day (BID) | ORAL | Status: DC
Start: 2022-03-04 — End: 2022-03-05
  Administered 2022-03-04 – 2022-03-05 (×3): 200 mg via ORAL
  Filled 2022-03-04 (×3): qty 1

## 2022-03-04 MED ORDER — INSULIN ASPART 100 UNIT/ML IJ SOLN: 0.0000 [IU] | Freq: Every day | INTRAMUSCULAR | Status: DC

## 2022-03-04 MED ORDER — OXYCODONE HCL 5 MG PO TABS: 5.0000 mg | ORAL_TABLET | ORAL | Status: DC | PRN

## 2022-03-04 MED ORDER — INSULIN ASPART 100 UNIT/ML IJ SOLN
0.0000 [IU] | Freq: Three times a day (TID) | INTRAMUSCULAR | Status: DC
  Administered 2022-03-04 – 2022-03-05 (×2): 1 [IU] via SUBCUTANEOUS
  Filled 2022-03-04 (×2): qty 1

## 2022-03-04 NOTE — Progress Notes (Signed)
RN performed assessment with tele interpretor. Pt A&OX4. Still having numbness and weakness to RUE and RLE. On bedside rounds night shift RN reports that there has been a slight improvement in RUE strength. Pt educated on wearing neck brace when OOB. Denies pain at this time. Will continue to monitor.  ?
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
## 2022-03-04 NOTE — Progress Notes (Signed)
?  Chief Complaint:  ?Weakness ? ?HPI: [name] is here for evaluation of right arm and leg weakness that he says started last night. He had a fall and afterwards noted the right hand and foot were weak. He states there was some numbness but currently that has resolved. He denies any radiating pain. He denies any left side symptoms. Given this, a MRI of the cervical spine was obtained that showed stenosis at C4/5 and C5/6 with compression of spinal cord and edema. We are consulted for evaluation ? ?POD1: Doing well. MAPs maintained above 80.  Still with poor movement and feeling R hand ? ?ICU NEEDS RESOLVED ?NO ON PRESSORS ?NOT ON OXYGEN ? ? ?Allergies: No Known Allergies ? ?Medications Prior to Admission  ?Medication Sig Dispense Refill  ? acetaminophen (TYLENOL) 325 MG tablet Take 325 mg by mouth every 6 (six) hours as needed.    ? ? ? ?Blood pressure (!) 160/102, pulse 82, temperature 98.9 ?F (37.2 ?C), temperature source Oral, resp. rate 20, height 5\' 1"  (1.549 m), weight 72.6 kg, SpO2 97 %. ? ?ROS ?Awake, spanish speaking ?In cervical collar ? ?EXAM ?RT sided weakness ?All other exam findings WNL ? ? ? ? ? 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 ?Clinically improving ?Needs PT/OT ?Follow up neurosurgery recs ? ?SD status ?Transfer to [facility] ? ? ?Corrin Parker, M.D.  ?[facility]  ?Medical Director [place] ?Medical Director [facility]io[facility] Department  ? ? ? ? ?.  ? ? ? ? ?

## 2022-03-04 NOTE — Anesthesia Postprocedure Evaluation (Signed)
Anesthesia Post Note ? ?Patient: Timothy Wright ? ?Procedure(s) Performed: ANTERIOR CERVICAL DECOMPRESSION/DISCECTOMY FUSION 2 LEVELS (Left: Spine Cervical) ? ?Patient location during evaluation: ICU ?Anesthesia Type: General ?Level of consciousness: awake ?Pain management: pain level controlled ?Vital Signs Assessment: post-procedure vital signs reviewed and stable ?Respiratory status: spontaneous breathing and respiratory function stable ?Cardiovascular status: stable ?Postop Assessment: no apparent nausea or vomiting ?Anesthetic complications: no ? ? ?No notable events documented. ? ? ?Last Vitals:  ?Vitals:  ? 03/04/22 0600 03/04/22 0700  ?BP: (!) 145/84 129/88  ?Pulse: 75 70  ?Resp: 15 14  ?Temp:    ?SpO2: 96% 97%  ?  ?Last Pain:  ?Vitals:  ? 03/04/22 0400  ?TempSrc: Oral  ?PainSc: 0-No pain  ? ? ?  ?  ?  ?  ?  ?  ? ?Aline Brochure M ? ? ? ? ?
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
## 2022-03-04 NOTE — Progress Notes (Signed)
?  Chief Complaint: Weakness ?HPI: [name] is here for evaluation of right arm and leg weakness that he says started last night. He had a fall and afterwards noted the right hand and foot were weak. He states there was some numbness but currently that has resolved. He denies any radiating pain. He denies any left side symptoms. Given this, a MRI of the cervical spine was obtained that showed stenosis at C4/5 and C5/6 with compression of spinal cord and edema. We are consulted for evaluation ? ?POD1: Doing well. MAPs maintained above 80.  Still with poor movement and feeling R hand ? ?Allergies: No Known Allergies ? ?Medications Prior to Admission  ?Medication Sig Dispense Refill  ? acetaminophen (TYLENOL) 325 MG tablet Take 325 mg by mouth every 6 (six) hours as needed.    ? ? ? ?Blood pressure (!) 145/84, pulse 75, temperature 99.4 ?F (37.4 ?C), temperature source Oral, resp. rate 15, height 5\' 1"  (1.549 m), weight 72.6 kg, SpO2 96 %. ?Physical Exam  ?Awake, spanish speaking ?In cervical collar ?5/5 strength in bilateral deltoid, bicep, tricep and left grip. On right, he is 2/5 in grip and IO ?In LE, he is 5/5 bilateral except 3/5 in DF/PF on right ?Sensation intact throughout ? 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  ? ?He has shown some improvement but still has deficits after surgery. ? ?- Continue MAP goals > 59mm Hg ?- PTOT ?- OK to remove brace and wear when OOB ? ?87m, MD ?03/04/2022, 6:58 AM ? ? ? ?

## 2022-03-05 LAB — RENAL FUNCTION PANEL
Albumin: 3.5 g/dL (ref 3.5–5.0)
Anion gap: 7 (ref 5–15)
BUN: 19 mg/dL (ref 6–20)
CO2: 26 mmol/L (ref 22–32)
Calcium: 8.5 mg/dL — ABNORMAL LOW (ref 8.9–10.3)
Chloride: 106 mmol/L (ref 98–111)
Creatinine, Ser: 0.6 mg/dL — ABNORMAL LOW (ref 0.61–1.24)
GFR, Estimated: >60 mL/min (ref >60)
Glucose, Bld: 115 mg/dL — ABNORMAL HIGH (ref 70–99)
Phosphorus: 3.1 mg/dL (ref 2.5–4.6)
Potassium: 3.5 mmol/L (ref 3.5–5.1)
Sodium: 139 mmol/L (ref 135–145)

## 2022-03-05 LAB — CBC
HCT: 38.4 % — ABNORMAL LOW (ref 39.0–52.0)
Hemoglobin: 13 g/dL (ref 13.0–17.0)
MCH: 29.4 pg (ref 26.0–34.0)
MCHC: 33.9 g/dL (ref 30.0–36.0)
MCV: 86.9 fL (ref 80.0–100.0)
Platelets: 230 10*3/uL (ref 150–400)
RBC: 4.42 MIL/uL (ref 4.22–5.81)
RDW: 12.8 % (ref 11.5–15.5)
WBC: 8.7 10*3/uL (ref 4.0–10.5)
nRBC: 0 % (ref 0.0–0.2)

## 2022-03-05 LAB — GLUCOSE, CAPILLARY
Glucose-Capillary: 113 mg/dL — ABNORMAL HIGH (ref 70–99)
Glucose-Capillary: 115 mg/dL — ABNORMAL HIGH (ref 70–99)
Glucose-Capillary: 145 mg/dL — ABNORMAL HIGH (ref 70–99)
Glucose-Capillary: 117 mg/dL — ABNORMAL HIGH (ref 70–99)

## 2022-03-05 LAB — MAGNESIUM: Magnesium: 1.9 mg/dL (ref 1.7–2.4)

## 2022-03-05 LAB — HEMOGLOBIN A1C
Hgb A1c MFr Bld: 5.5 % (ref 4.8–5.6)
Mean Plasma Glucose: 111.15 mg/dL

## 2022-03-05 MED ORDER — METHOCARBAMOL 500 MG PO TABS: 500.0000 mg | ORAL_TABLET | Freq: Three times a day (TID) | ORAL | 0 refills | Status: AC | PRN

## 2022-03-05 MED ORDER — POTASSIUM CHLORIDE CRYS ER 20 MEQ PO TBCR
40.0000 meq | EXTENDED_RELEASE_TABLET | Freq: Once | ORAL | Status: AC
  Administered 2022-03-05: 40 meq via ORAL
  Filled 2022-03-05: qty 2

## 2022-03-05 MED ORDER — OXYCODONE HCL 5 MG PO TABS: 5.0000 mg | ORAL_TABLET | ORAL | 0 refills | Status: AC | PRN

## 2022-03-05 MED ORDER — CELECOXIB 200 MG PO CAPS: 200.0000 mg | ORAL_CAPSULE | Freq: Two times a day (BID) | ORAL | 0 refills | Status: AC

## 2022-03-05 NOTE — Discharge Summary (Signed)
?Physician Discharge Summary  ?Timothy Wright KAJ:[id number] DOB: 05-05-1971 DOA: 03/02/2022 ? ?PCP: Pcp, No ? ?Admit date: 03/02/2022 ?Discharge date: 03/05/2022 ? ?Admitted From: home ?Disposition:  home, working to arrange charity home health / PT ? ?Recommendations for Outpatient Follow-up:  ?Follow up with neurosurgery in 1 week  ? ?Home Health: ordered - barrier is lack of insurance/PCP  ?Equipment/Devices: neck brace, walker  ? ?Discharge Condition: good  ?CODE STATUS: FULL  ?Diet recommendation:  ?Diet Orders (From admission, onward)  ? ?  Start     Ordered  ? 03/05/22 0000  Diet - low sodium heart healthy       ? 03/05/22 1242  ? 03/03/22 1844  DIET SOFT Room service appropriate? Yes; Fluid consistency: Thin  Diet effective now       ?Comments: Per MD advance as tolerated  ?Question Answer Comment  ?Room service appropriate? Yes   ?Fluid consistency: Thin   ?  ? 03/03/22 1845  ? ?  ?  ? ?  ? ? ? ?Brief/Interim Summary: ?[name] is here for evaluation of right arm and leg weakness that he says started night prior to admission. He had a fall and afterwards noted the right hand and foot were weak. He stated there was some numbness but currently which has resolved. He denied any radiating pain. He denied any left side symptoms. Given this, a MRI of the cervical spine was obtained that showed stenosis at C4/5 and C5/6 with compression of spinal cord and edema. ICU and neurosurgery were consulted for evaluation. Underwent C4/5, C5/6 Anterior Cervical Discectomy and Fusion 03/03/22 and continued to do well post-op, stable for discharge 03/05/22  ? ?Consultants:  ?ICU ?Neurosurgery  ? ?Procedures:  ?03/03/22: C4/5, C5/6 Anterior Cervical Discectomy and Fusion ? ? ? ? ?Discharge Diagnoses: ?Principal Problem: ?  Cervical spinal cord compression (HCC) ?Active Problems: ?  Cervical disc herniation ? ? ? ?Assessment & Plan: ?Postoperative from C4/5, C5/6 Anterior Cervical Discectomy and Fusion to  treat cervical myelopathy. Recovering well except some residual weakness in L hand, will need PT and follow up with surgery.  ? ? ? ? ? ? ?Discharge Instructions ? ?Discharge Instructions   ? ? Call MD for:  persistant dizziness or light-headedness   Complete by: As directed ?  ? Call MD for:  redness, tenderness, or signs of infection (pain, swelling, redness, odor or green/yellow discharge around incision site)   Complete by: As directed ?  ? Change dressing (specify)   Complete by: As directed ?  ? Dressing change: 1-2 times per day using clean dry bandages.  ? Diet - low sodium heart healthy   Complete by: As directed ?  ? Discharge instructions   Complete by: As directed ?  ? Please follow up with neurosurgery in 1 week ?Please wear neck brace - can take this off when you are awake and resting but if you are sleeping or walking around please wear the brace  ? Increase activity slowly   Complete by: As directed ?  ? ?  ? ?Allergies as of 03/05/2022   ?No Known Allergies ?  ? ?  ?Medication List  ?  ? ?STOP taking these medications   ? ?acetaminophen 325 MG tablet ?Commonly known as: TYLENOL ?  ? ?  ? ?TAKE these medications   ? ?celecoxib 200 MG capsule ?Commonly known as: CELEBREX ?Take 1 capsule (200 mg total) by mouth 2 (two) times daily for 15 days. ?  ?  methocarbamol 500 MG tablet ?Commonly known as: ROBAXIN ?Take 1 tablet (500 mg total) by mouth every 8 (eight) hours as needed for muscle spasms. ?  ?oxyCODONE 5 MG immediate release tablet ?Commonly known as: Oxy IR/ROXICODONE ?Take 1 tablet (5 mg total) by mouth every 4 (four) hours as needed for severe pain. ?  ? ?  ? ?  ?  ? ? ?  ?Durable Medical Equipment  ?(From admission, onward)  ?  ? ? ?  ? ?  Start     Ordered  ? 03/04/22 1211  For home use only DME Walker rolling  Once       ?Question Answer Comment  ?Walker: With 5 Inch Wheels   ?Patient needs a walker to treat with the following condition Difficulty walking   ?  ? 03/04/22 1210  ? ?  ?  ? ?  ? ? ?   ?Discharge Care Instructions  ?(From admission, onward)  ?  ? ? ?  ? ?  Start     Ordered  ? 03/05/22 0000  Change dressing (specify)       ?Comments: Dressing change: 1-2 times per day using clean dry bandages.  ? 03/05/22 1242  ? ?  ?  ? ?  ? ?Diet Orders (From admission, onward)  ? ?  Start     Ordered  ? 03/05/22 0000  Diet - low sodium heart healthy       ? 03/05/22 1242  ? 03/03/22 1844  DIET SOFT Room service appropriate? Yes; Fluid consistency: Thin  Diet effective now       ?Comments: Per MD advance as tolerated  ?Question Answer Comment  ?Room service appropriate? Yes   ?Fluid consistency: Thin   ?  ? 03/03/22 1845  ? ?  ?  ? ?  ? ? ? ? ?No Known Allergies ? ? ?Subjective: obtained w/ assistance from interpreter, pt reports feeling welll, pain controlled, only having some residual weakness/numbness in 4th 5th fingers of R hand, no CP/SOB, he would like to go home  ? ? ?Discharge Exam: ?Vitals:  ? 03/05/22 0900 03/05/22 1000  ?BP:  (!) 142/93  ?Pulse: 75 72  ?Resp: 18 17  ?Temp:    ?SpO2: 95% 94%  ? ?Vitals:  ? 03/05/22 0700 03/05/22 0800 03/05/22 0900 03/05/22 1000  ?BP: (!) 158/86 (!) 142/76  (!) 142/93  ?Pulse: 67 (!) 56 75 72  ?Resp: 17 15 18 17   ?Temp:  99 ?F (37.2 ?C)    ?TempSrc:  Oral    ?SpO2: 96% 94% 95% 94%  ?Weight:      ?Height:      ? ? ?General: Pt is alert, awake, not in acute distress ?Cardiovascular: RRR, S1/S2 +, no rubs, no gallops ?Respiratory: CTA bilaterally, no wheezing, no rhonchi ?Abdominal: Soft, NT, ND, bowel sounds + ?Extremities: no edema, no cyanosis ?Neuro: awake, alert. Slight decreased sensation 4th 5th digits R hand, slight decreased R grip strength compared to L ? ? ? ? ?The results of significant diagnostics from this hospitalization (including imaging, microbiology, ancillary and laboratory) are listed below for reference.   ? ? ?Microbiology: ?Recent Results (from the past 240 hour(s))  ?Resp Panel by RT-PCR (Flu A&B, Covid) Nasopharyngeal Swab     Status: None  ?  Collection Time: 03/02/22  9:56 PM  ? Specimen: Nasopharyngeal Swab; Nasopharyngeal(NP) swabs in vial transport medium  ?Result Value Ref Range Status  ? SARS Coronavirus 2 by RT PCR NEGATIVE NEGATIVE  Final  ?  Comment: (NOTE) ?SARS-CoV-2 target nucleic acids are NOT DETECTED. ? ?The SARS-CoV-2 RNA is generally detectable in upper respiratory ?specimens during the acute phase of infection. The lowest ?concentration of SARS-CoV-2 viral copies this assay can detect is ?138 copies/mL. A negative result does not preclude SARS-Cov-2 ?infection and should not be used as the sole basis for treatment or ?other patient management decisions. A negative result may occur with  ?improper specimen collection/handling, submission of specimen other ?than nasopharyngeal swab, presence of viral mutation(s) within the ?areas targeted by this assay, and inadequate number of viral ?copies(<138 copies/mL). A negative result must be combined with ?clinical observations, patient history, and epidemiological ?information. The expected result is Negative. ? ?Fact Sheet for Patients:  ?BloggerCourse.comhttps://www.fda.gov/media/152166/download ? ?Fact Sheet for Healthcare Providers:  ?SeriousBroker.ithttps://www.fda.gov/media/152162/download ? ?This test is no t yet approved or cleared by the [place] FDA and  ?has been authorized for detection and/or diagnosis of SARS-CoV-2 by ?FDA under an Emergency Use Authorization (EUA). This EUA will remain  ?in effect (meaning this test can be used) for the duration of the ?COVID-19 declaration under Section 564(b)(1) of the Act, 21 ?U.S.C.section 360bbb-3(b)(1), unless the authorization is terminated  ?or revoked sooner.  ? ? ?  ? Influenza A by PCR NEGATIVE NEGATIVE Final  ? Influenza B by PCR NEGATIVE NEGATIVE Final  ?  Comment: (NOTE) ?The Xpert Xpress SARS-CoV-2/FLU/RSV plus assay is intended as an aid ?in the diagnosis of influenza from Nasopharyngeal swab specimens and ?should not be used as a sole basis for treatment.  Nasal washings and ?aspirates are unacceptable for Xpert Xpress SARS-CoV-2/FLU/RSV ?testing. ? ?Fact Sheet for Patients: ?BloggerCourse.comhttps://www.fda.gov/media/152166/download ? ?Fact Sheet for Healthcare Providers: ?htt

## 2022-03-05 NOTE — Progress Notes (Signed)
?  Chief Complaint: Weakness ?HPI: [name] is here for evaluation of right arm and leg weakness that he says started last night. He had a fall and afterwards noted the right hand and foot were weak. He states there was some numbness but currently that has resolved. He denies any radiating pain. He denies any left side symptoms. Given this, a MRI of the cervical spine was obtained that showed stenosis at C4/5 and C5/6 with compression of spinal cord and edema. We are consulted for evaluation ? ?POD2: Doing well.   ? ?POD1: Doing well. MAPs maintained above 80.  Still with poor movement and feeling R hand ? ?Allergies: No Known Allergies ? ?Medications Prior to Admission  ?Medication Sig Dispense Refill  ? acetaminophen (TYLENOL) 325 MG tablet Take 325 mg by mouth every 6 (six) hours as needed.    ? ? ? ?Blood pressure (!) 158/86, pulse 67, temperature 98.9 ?F (37.2 ?C), temperature source Oral, resp. rate 17, height 5\' 1"  (1.549 m), weight 72.7 kg, SpO2 96 %. ?Physical Exam  ?Awake, spanish speaking ?In cervical collar ?5/5 strength in bilateral deltoid, bicep, tricep and left grip. On right, he is 2/5 in grip and IO ?In LE, he is 5/5 bilateral except 3/5 in DF/PF on right ?Sensation tingling in BUE but otherwise intact ? 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  ? ?He has shown some improvement but still has deficits after surgery. ? ?- Consider dispo planning ?- PTOT ?- OK to remove brace and wear when OOB ? ? , MD ?03/05/2022, 8:03 AM ? ? ? ?

## 2022-03-05 NOTE — TOC Transition Note (Signed)
Transition of Care (TOC) - CM/SW Discharge Note ? ? ?Patient Details  ?Name: Charvez Voorhies [facility] ?MRN: [id number] ?Date of Birth: Apr 05, 1971 ? ?Transition of Care (TOC) CM/SW Contact:  ?Candie Chroman, LCSW ?Phone Number: ?03/05/2022, 1:13 PM ? ? ?Clinical Narrative:  Patient has orders to discharge home today. CSW met with patient. No supports at bedside. Interpreter Ophelia Shoulder #[id number]. CSW introduced role and explained that discharge planning would be discussed. Patient confirmed he does not have insurance or a PCP. Unable to set up home health because of this. Patient expressed understanding. Gave packet for free/low-cost healthcare in [facility] and intake paperwork for [employer]. Patient agreeable to RW. Ordered charity walker through [employer]. Gave GoodRx coupons for prescriptions. Wife will pick him up around 3:30. No further concerns. CSW signing off.  ? ?Final next level of care: Home/Self Care ?Barriers to Discharge: No Barriers Identified ? ? ?Patient Goals and CMS Choice ?  ?  ?  ? ?Discharge Placement ?  ?           ?  ?Patient to be transferred to facility by: Wife ?  ?Patient and family notified of of transfer: 03/05/22 ? ?Discharge Plan and Services ?  ?  ?           ?DME Arranged: Walker rolling ?DME Agency: [employer] ?Date DME Agency Contacted: 03/05/22 ?  ?Representative spoke with at DME Agency: Suanne Marker ?  ?  ?  ?  ?  ? ?Social Determinants of Health (SDOH) Interventions ?  ? ? ?Readmission Risk Interventions ?   ? View : No data to display.  ?  ?  ?  ? ? ? ? ? ?

## 2022-03-05 NOTE — Progress Notes (Signed)
PHARMACY CONSULT NOTE ? ?Pharmacy Consult for Electrolyte Monitoring and Replacement  ? ?Recent Labs: ?Potassium (mmol/L)  ?Date Value  ?03/05/2022 3.5  ? ?Magnesium (mg/dL)  ?Date Value  ?03/05/2022 1.9  ? ?Calcium (mg/dL)  ?Date Value  ?03/05/2022 8.5 (L)  ? ?Albumin (g/dL)  ?Date Value  ?03/05/2022 3.5  ? ?Phosphorus (mg/dL)  ?Date Value  ?03/05/2022 3.1  ? ?Sodium (mmol/L)  ?Date Value  ?03/05/2022 139  ? ? ?Assessment: ?51 y.o. male without significant past medical history who presented for evaluation of right-sided facial abrasions/weakness in his right arm/leg that he noticed after he jumped off a truck onto the ground striking his face. Pharmacy consulted for electrolyte replacement.  ? ?Goal of Therapy:  ?Electrolytes WNL ? ?Plan:  ?No electrolyte replacement warranted for today ?F/u electrolytes with AM labs ? ?Lowella Bandy ,PharmD ?Clinical Pharmacist ?03/05/2022 7:09 AM ? ?

## 2022-03-07 ENCOUNTER — Ambulatory Visit: Payer: Self-pay

## 2022-03-07 NOTE — Telephone Encounter (Signed)
Byrd Hesselbach called bc she has questions about how to change the patient's bandages, please advise   Best contact: [phone] (pt's daughter)     Chief Complaint: Daughter asking about dressing change for surgical incision. Pt. Had surgery 03/03/22. Symptoms: None Frequency: n/a Pertinent Negatives: Patient denies  Disposition: [] ED /[] Urgent Care (no appt availability in office) / [] Appointment(In office/virtual)/ []  [place] [facility]/ [] Home Care/ [] Refused Recommended Disposition /[] [place] [facility]/ []  Follow-up with PCP Additional Notes: Instructed daughter surgical dressings need to be kept clean and dry. Instructed to call pt.'s surgeon for orders.  Verbalizes understanding. Answer Assessment - Initial Assessment Questions 1. REASON FOR CALL: "What is your main concern right now?"     Changing surgical incision dressing 2. ONSET: "When did the  start?"     03/03/22 3. SEVERITY: "How bad is the ?"     N/a 4. FEVER: "Do you have a fever?"     No 5. OTHER SYMPTOMS: "Do you have any other new symptoms?"     No 6. TREATMENTS AND RESPONSE: "What have you done so far to try to make this better? What medicines have you used?"     N/a 7. PREGNANCY: "Is there any chance you are pregnant?" "When was your last menstrual period?"     N/a  Protocols used: No Guideline Available-A-AH

## 2023-05-20 IMAGING — CT CT MAXILLOFACIAL W/O CM
3 of 6 series · 15 of 47 positions shown, 18 images · non-contrast
Comparison: Head CT obtained earlier today.

CLINICAL DATA: Neck and facial trauma.  Status post fall.

EXAM:
CT MAXILLOFACIAL WITHOUT CONTRAST
CT CERVICAL SPINE WITHOUT CONTRAST
TECHNIQUE: Multidetector CT imaging of the maxillofacial structures was
performed. Multiplanar CT image reconstructions were also generated.
A small metallic BB was placed on the right temple in order to
reliably differentiate right from left.

[Series 2: maxilllofacial 2.0 hr40 3 · axial · 0.41mm/px · z∈[-175,+5]mm · 10 of 106 slices shown, 13 images]
[im 8/106  brain]
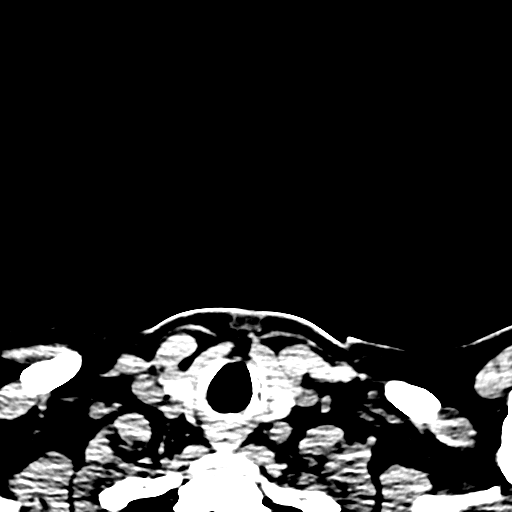
[im 8/106  bone]
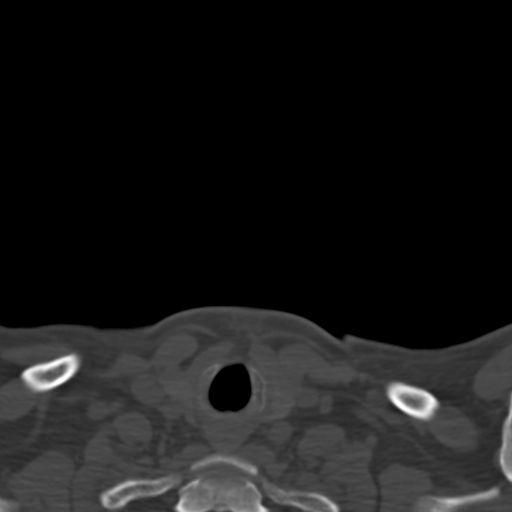
[im 16/106  bone]
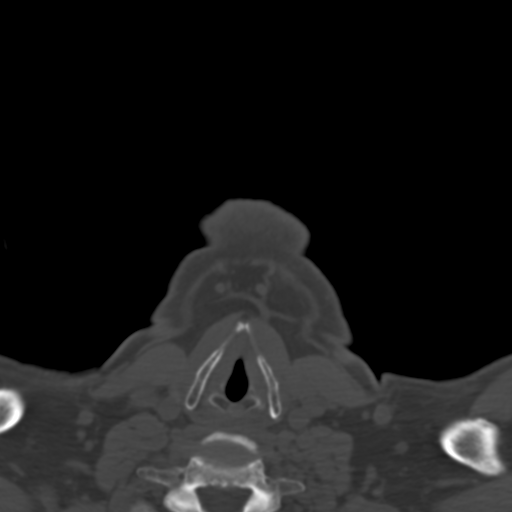
[im 31/106  bone]
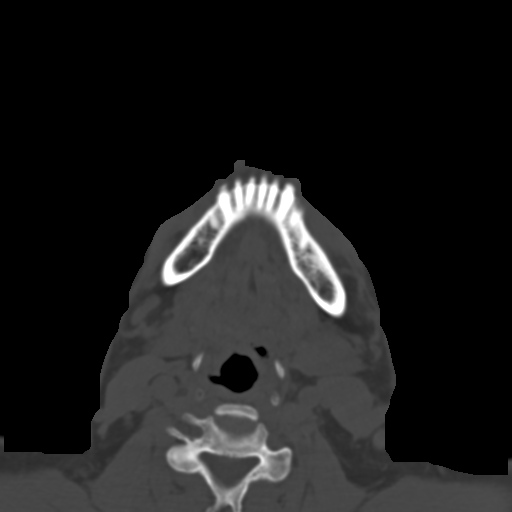
[im 38/106  bone]
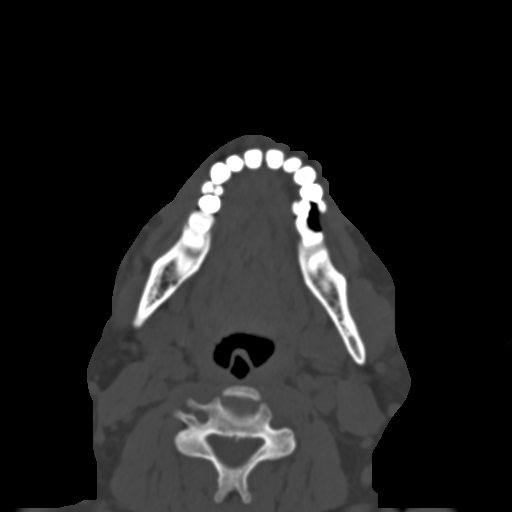
[im 46/106  brain]
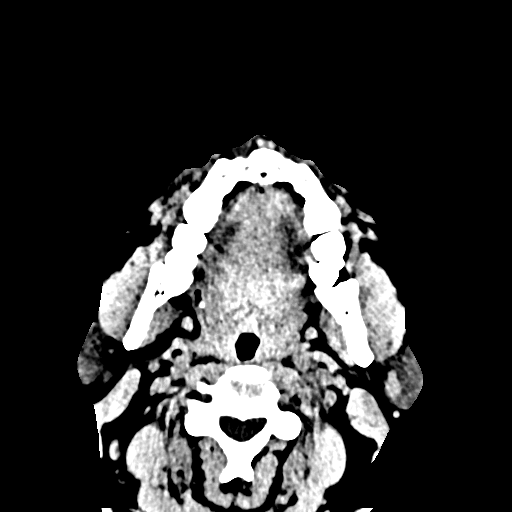
[im 46/106  bone]
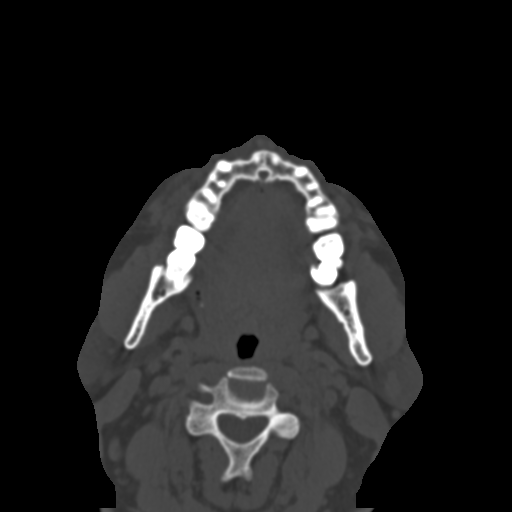
[im 61/106  bone]
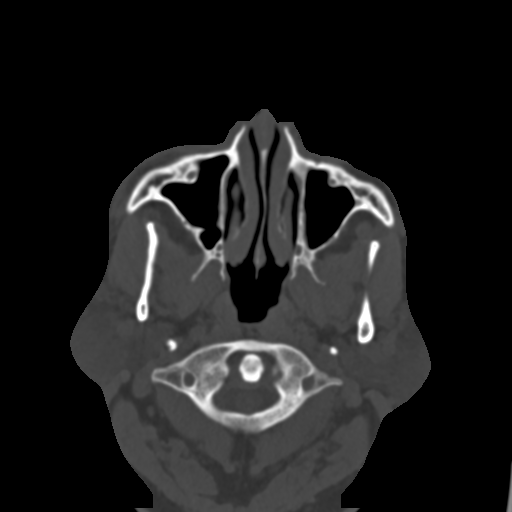
[im 68/106  bone]
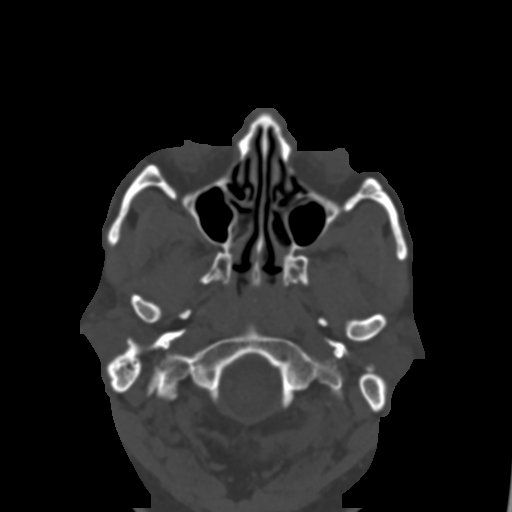
[im 76/106  bone]
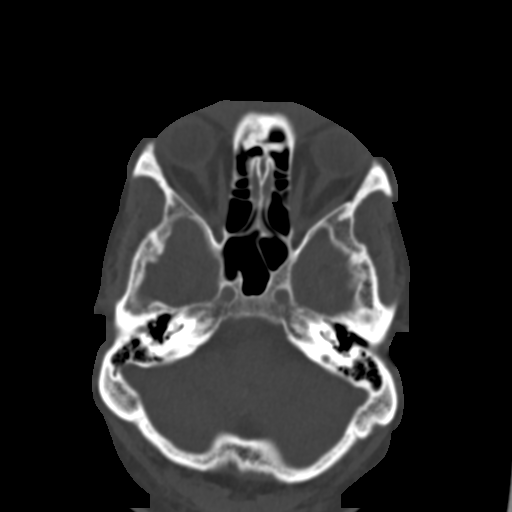
[im 91/106  brain]
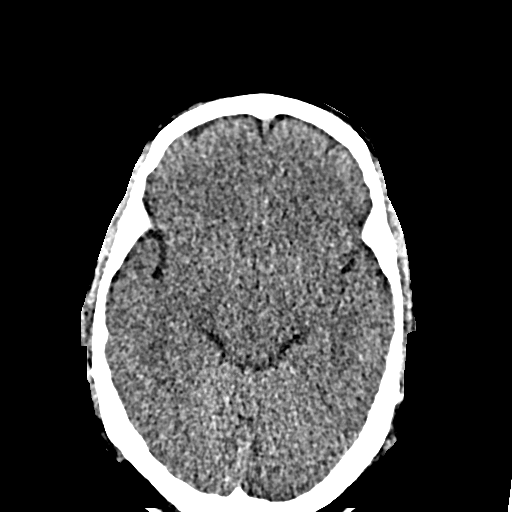
[im 91/106  bone]
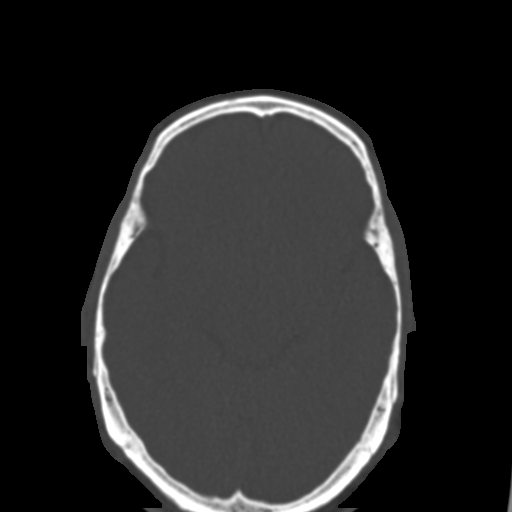
[im 98/106  bone]
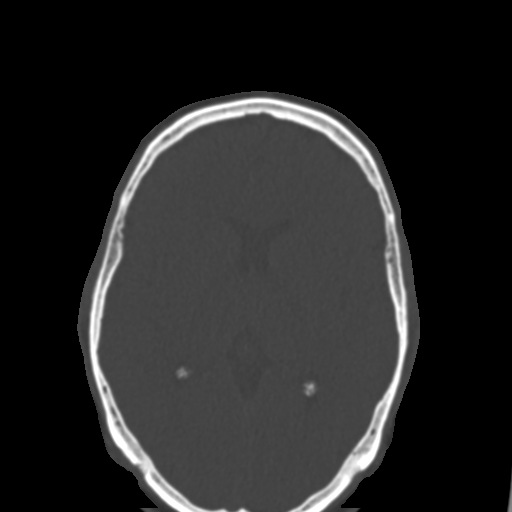

[Series 6: st cor · coronal · 0.47mm/px · 2 of 121 slices shown]
[im 41/121  bone]
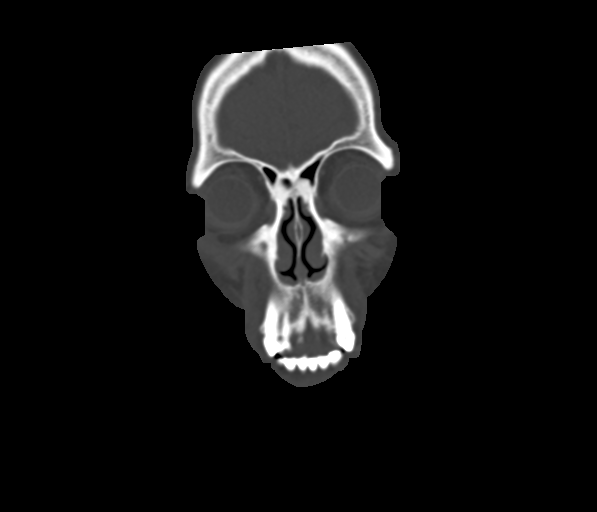
[im 81/121  bone]
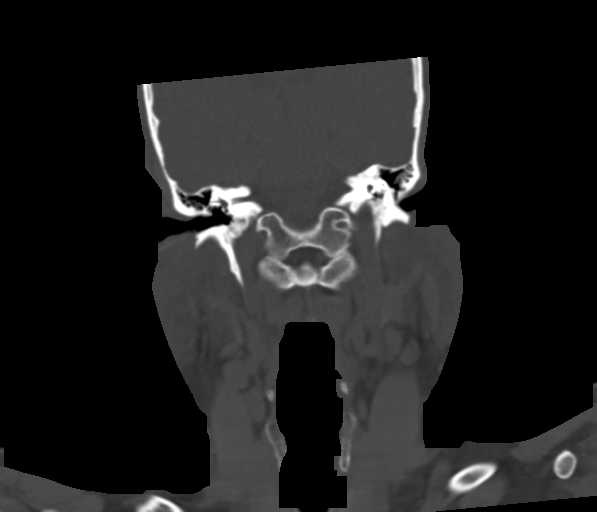

[Series 9: bone sag · sagittal · 0.44mm/px · 3 of 222 slices shown]
[im 81/222  bone]
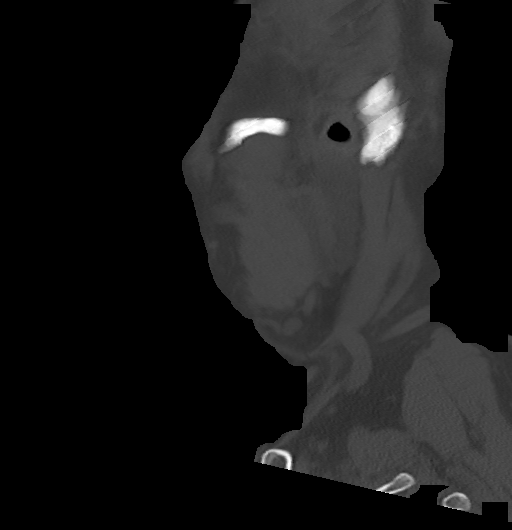
[im 115/222  bone]
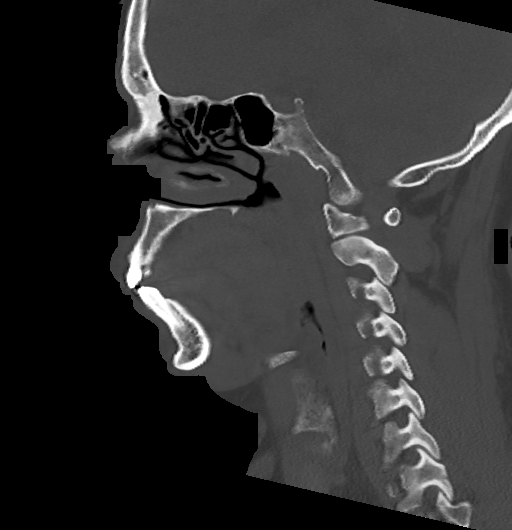
[im 149/222  bone]
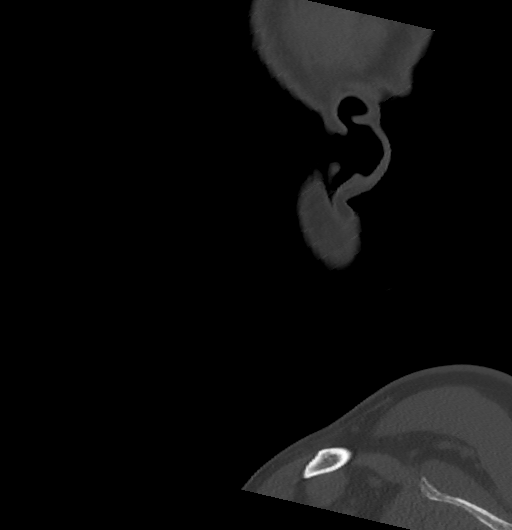

[15 of 47 positions shown; findings below may reference images not displayed]

Multidetector CT imaging of the cervical spine was performed without
intravenous contrast. Multiplanar CT image reconstructions were also
generated.

RADIATION DOSE REDUCTION: This exam was performed according to the
departmental dose-optimization program which includes automated
exposure control, adjustment of the mA and/or kV according to
patient size and/or use of iterative reconstruction technique.
FINDINGS: CT MAXILLOFACIAL FINDINGS

Osseous: No fracture or mandibular dislocation. No destructive
process.

Orbits: Negative. No traumatic or inflammatory finding.

Sinuses: Clear.

Soft tissues: Negative.

Limited intracranial: No significant or unexpected finding.

CT CERVICAL FINDINGS

Alignment: Normal.

Skull base and vertebrae: No acute fracture. No primary bone lesion
or focal pathologic process.

Soft tissues and spinal canal: No prevertebral fluid or swelling. No
visible canal hematoma.

Disc levels: Disc spaces are well preserved. Minor disc bulging at
C5-C6. No evidence of a disc herniation. No stenosis.

Upper chest: Negative.

Other: None.
IMPRESSION: MAXILLOFACIAL CT

1. Negative.  No fracture.

CERVICAL CT

1. No fracture or acute finding.

## 2023-05-20 IMAGING — MR MR CERVICAL SPINE WO/W CM
5 of 8 series · 28 of 48 positions shown · IV contrast (7ml Gadavist)
Comparison: Prior CT from earlier the same day.

CLINICAL DATA: Initial evaluation for acute right-sided weakness
status post trauma.

EXAM:
MRI CERVICAL SPINE WITHOUT AND WITH CONTRAST
TECHNIQUE: Multiplanar and multiecho pulse sequences of the cervical spine, to
include the craniocervical junction and cervicothoracic junction,
were obtained without and with intravenous contrast.
CONTRAST:  7.5mL GADAVIST GADOBUTROL 1 MMOL/ML IV SOLN

[Series 16: T2 · sagittal · 3.0mm · 0.62mm/px · 4 of 15 slices shown (1 of 2)]
[im 1/15]
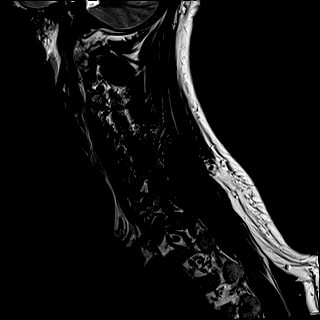
[im 5/15]
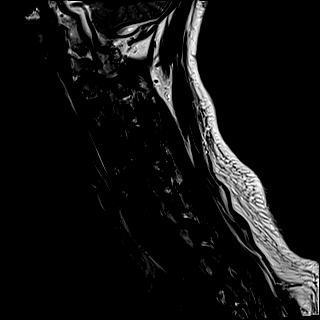
[im 10/15]
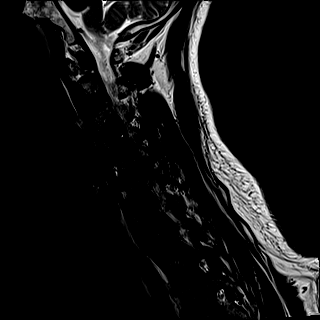
[im 15/15]
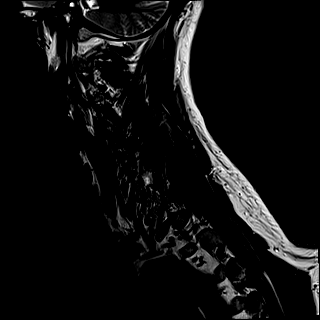

[Series 18: STIR · sagittal · 3.0mm · 0.62mm/px · 4 of 15 slices shown]
[im 1/15]
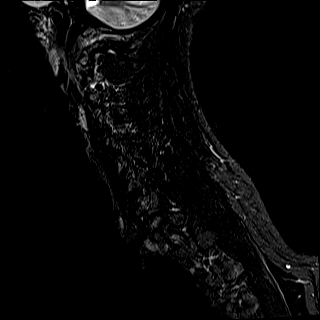
[im 5/15]
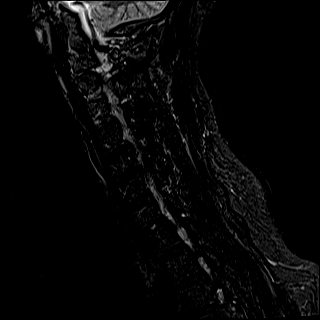
[im 10/15]
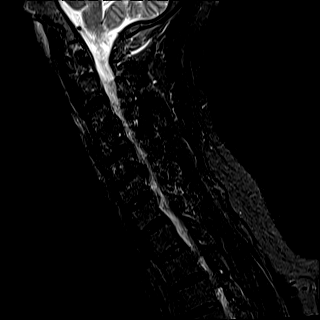
[im 15/15]
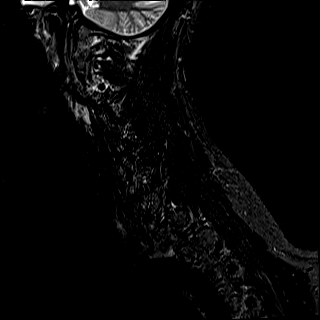

[Series 19: T2 · axial · 3.0mm · 0.70mm/px · z∈[-94,-6]mm · 8 of 29 slices shown (2 of 2)]
[im 1/29]
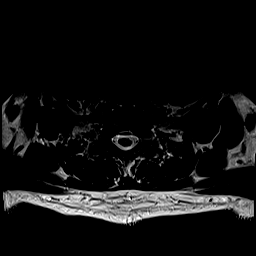
[im 5/29]
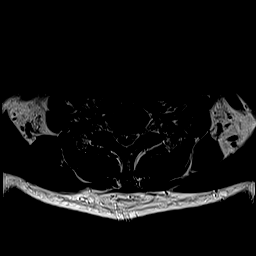
[im 9/29]
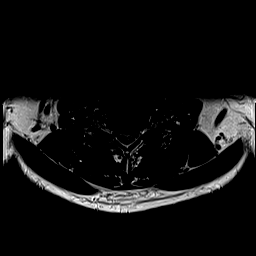
[im 13/29]
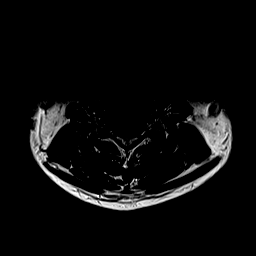
[im 17/29]
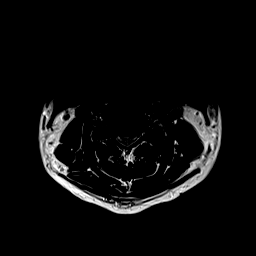
[im 21/29]
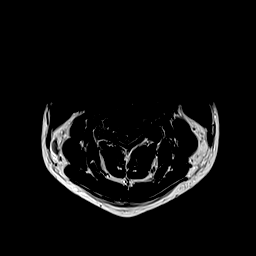
[im 25/29]
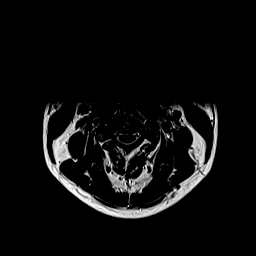
[im 29/29]
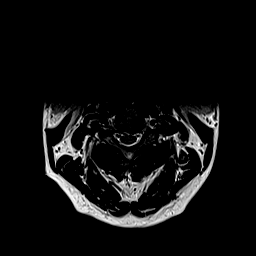

[Series 21: T1 · axial · non-contrast · 3.0mm · 0.35mm/px · z∈[-94,-6]mm · 8 of 29 slices shown]
[im 1/29]
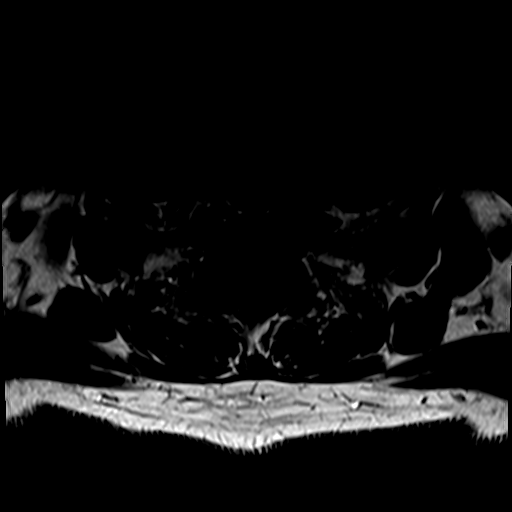
[im 5/29]
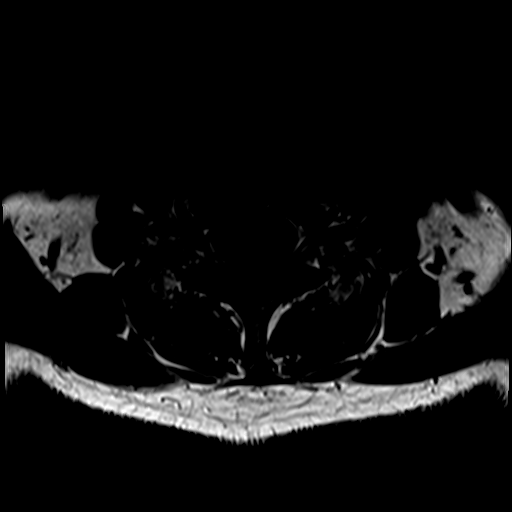
[im 9/29]
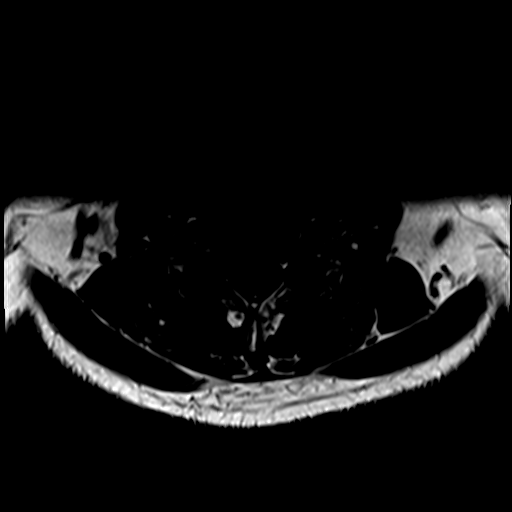
[im 13/29]
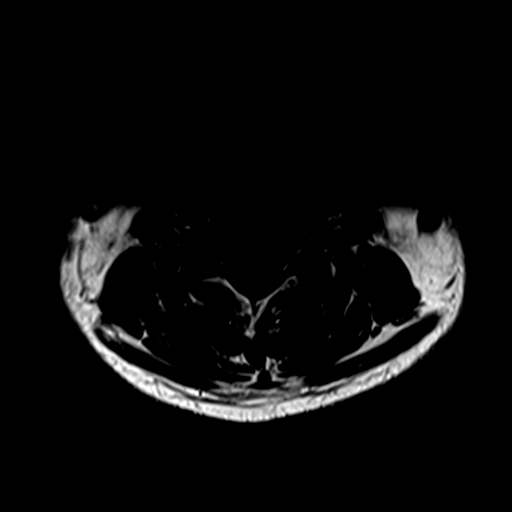
[im 17/29]
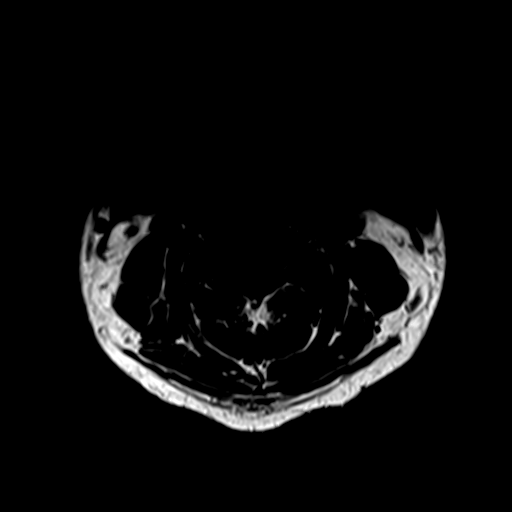
[im 21/29]
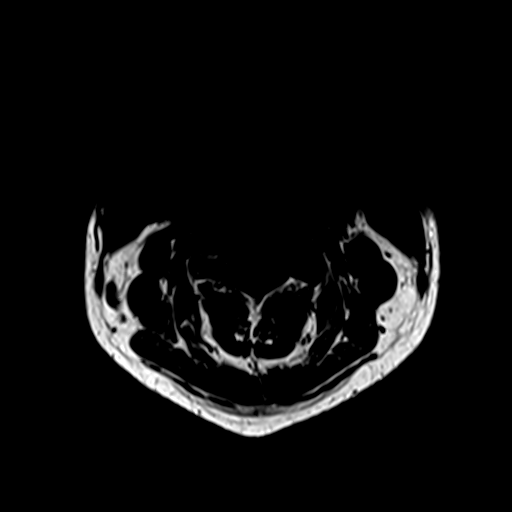
[im 25/29]
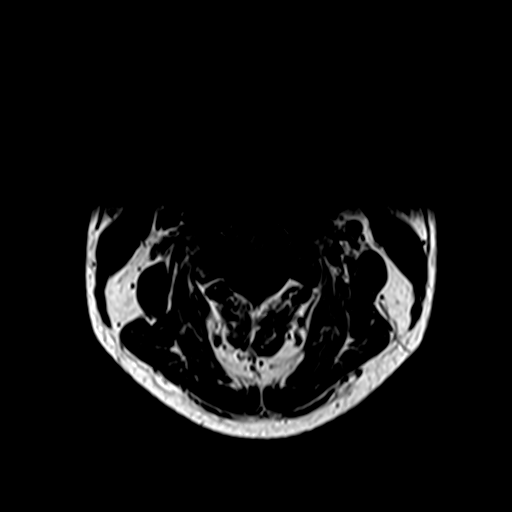
[im 29/29]
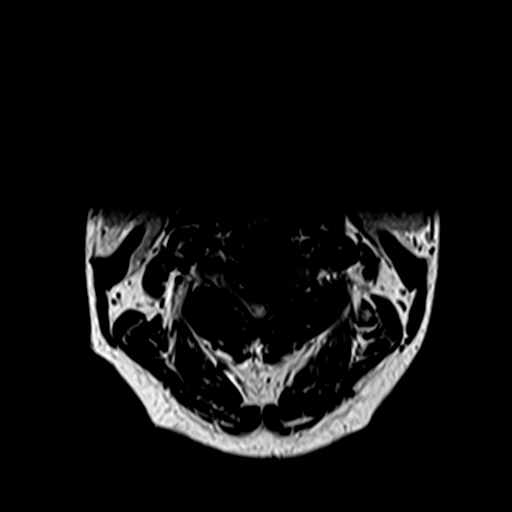

[Series 23: T1 post-contrast · axial · 3.0mm · 0.35mm/px · z∈[-94,-56]mm · 4 of 29 slices shown]
[im 1/29]
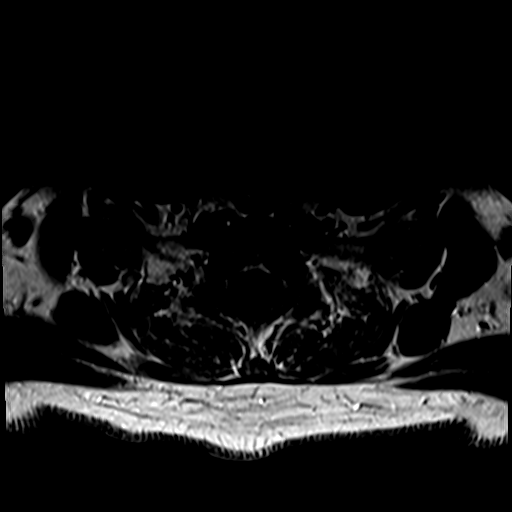
[im 5/29]
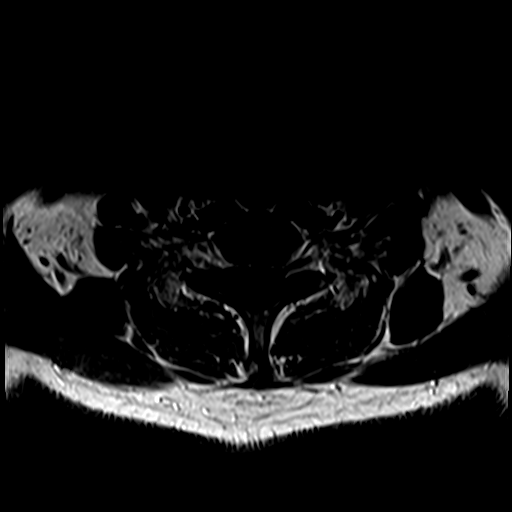
[im 9/29]
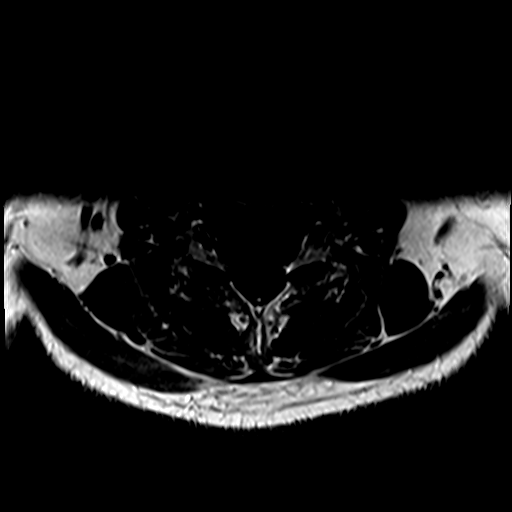
[im 13/29]
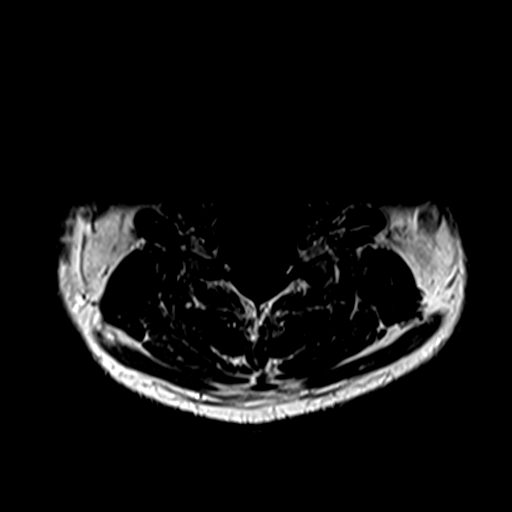

[28 of 48 positions shown; findings below may reference images not displayed]

FINDINGS: Alignment: Straightening with slight reversal of the normal cervical
lordosis. Trace retrolisthesis of C5 on C6.

Vertebrae: Vertebral body height maintained with no visible acute or
chronic fracture. Bone marrow signal intensity within normal limits.
No discrete or worrisome osseous lesions. No abnormal marrow edema
or enhancement.

Cord: Patchy signal abnormality seen involving the cervical cord at
the level of C5-6, concerning for acute cord injury/contusion
(series 18, image 7). Associated fate patchy enhancement. Subtle
edema within the interspinous region of C4-5 through C6-7, suspected
to reflect mild ligamentous injury/strain. Ligamentum flavum,
posterior longitudinal ligament, and anterior longitudinal ligaments
appear intact.

Posterior Fossa, vertebral arteries, paraspinal tissues: Visualized
brain and posterior fossa within normal limits. Craniocervical
junction normal. Edema seen within the suboccipital soft tissues,
likely reflecting soft tissue injury/muscular strain (series 22,
image 10). Paraspinous soft tissues demonstrate no other acute
finding. Normal flow voids seen within the vertebral arteries
bilaterally.

Disc levels:

C2-C3: Mild uncovertebral spurring without significant disc bulge.
No canal or foraminal stenosis.

C3-C4: Tiny central disc protrusion minimally indents the ventral
thecal sac. No significant spinal stenosis. Superimposed
uncovertebral hypertrophy with resultant mild to moderate bilateral
C4 foraminal stenosis, left worse than right.

C4-C5: Mild disc bulge with endplate and uncovertebral spurring,
slightly asymmetric to the right. Mild spinal stenosis. Moderate
left with mild-to-moderate right C5 foraminal stenosis.

C5-C6: Broad-based central disc protrusion flattens and effaces the
ventral thecal sac. Secondary cord flattening with associated cord
signal changes as above. Resultant severe spinal stenosis with the
thecal sac measuring 5 mm in AP diameter. Moderate bilateral C6
foraminal narrowing.

C6-C7: Small left paracentral disc protrusion indents the ventral
thecal sac. No significant spinal stenosis. Foramina remain patent.

C7-T1:  Negative interspace.  Mild facet hypertrophy.  No stenosis.
IMPRESSION: 1. Broad-based central disc protrusion at C5-6 with resultant severe
spinal stenosis and cord flattening. Associated cord signal changes
consistent with acute cord injury/contusion.
2. Mild edema within the interspinous region of C4-5 through C6-7,
suspected to reflect mild ligamentous injury/strain.
3. Mild soft tissue injury/muscular strain within the suboccipital
soft tissues.
4. Additional underlying mild multilevel cervical spondylosis as
above.

## 2023-05-20 IMAGING — CT CT CERVICAL SPINE W/O CM
3 series · 13 of 35 positions shown, 16 images · non-contrast
Comparison: Head CT obtained earlier today.

CLINICAL DATA: Neck and facial trauma.  Status post fall.

EXAM:
CT MAXILLOFACIAL WITHOUT CONTRAST
CT CERVICAL SPINE WITHOUT CONTRAST
TECHNIQUE: Multidetector CT imaging of the maxillofacial structures was
performed. Multiplanar CT image reconstructions were also generated.
A small metallic BB was placed on the right temple in order to
reliably differentiate right from left.

[Series 4: c spine soft · axial · 0.36mm/px · z∈[-206,-44]mm · 5 of 118 slices shown, 7 images]
[im 19/118  soft-tissue]
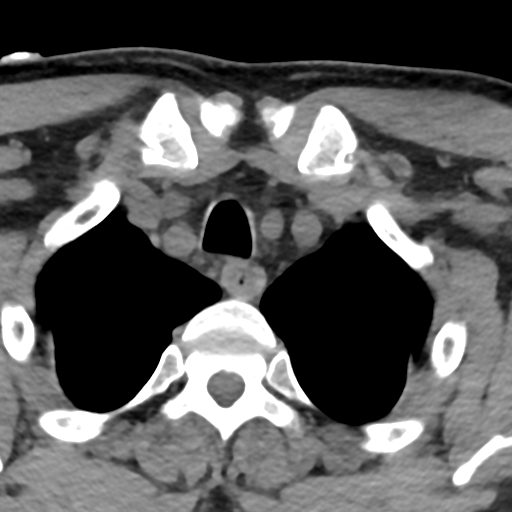
[im 19/118  bone]
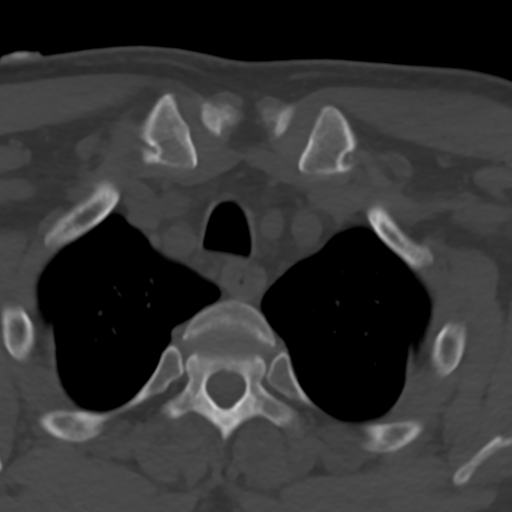
[im 37/118  bone]
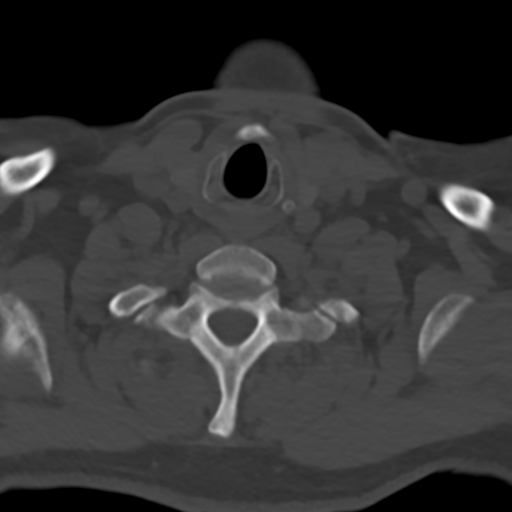
[im 64/118  bone]
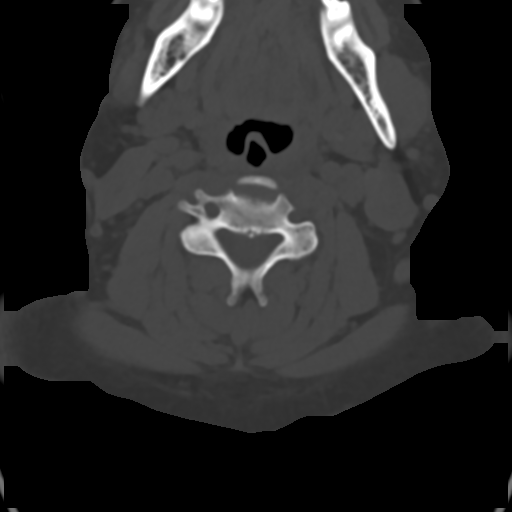
[im 82/118  bone]
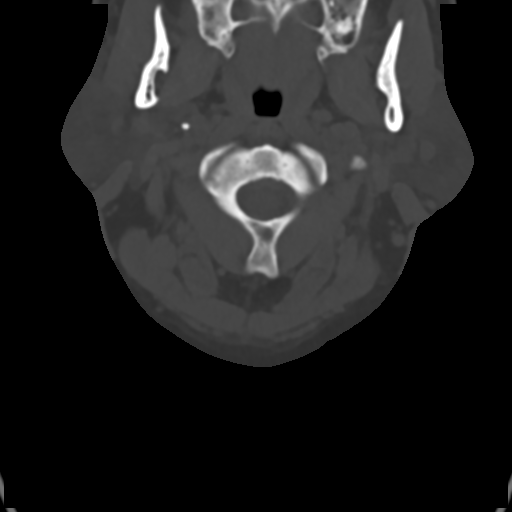
[im 100/118  soft-tissue]
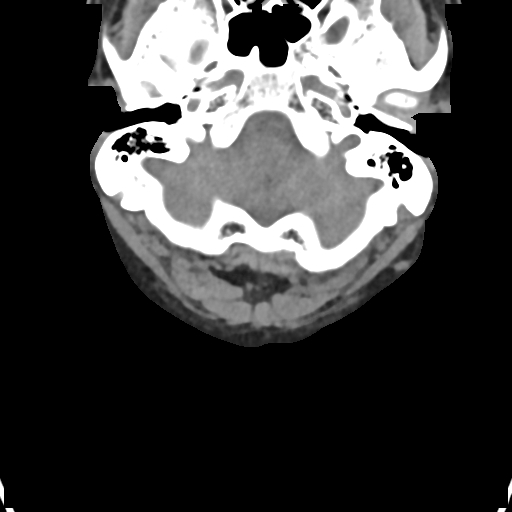
[im 100/118  bone]
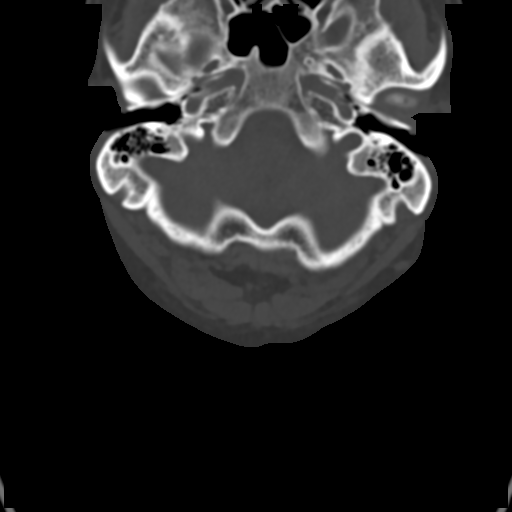

[Series 7: sag bone · sagittal · 0.39mm/px · 5 of 172 slices shown, 6 images]
[im 58/172  bone]
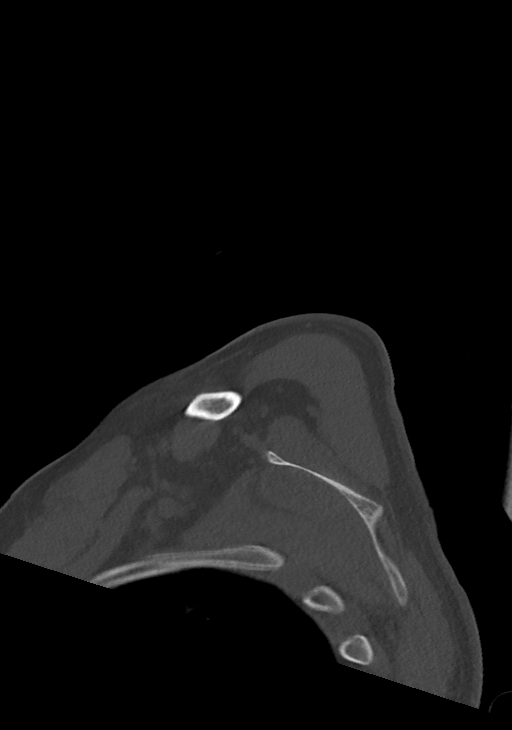
[im 72/172  bone]
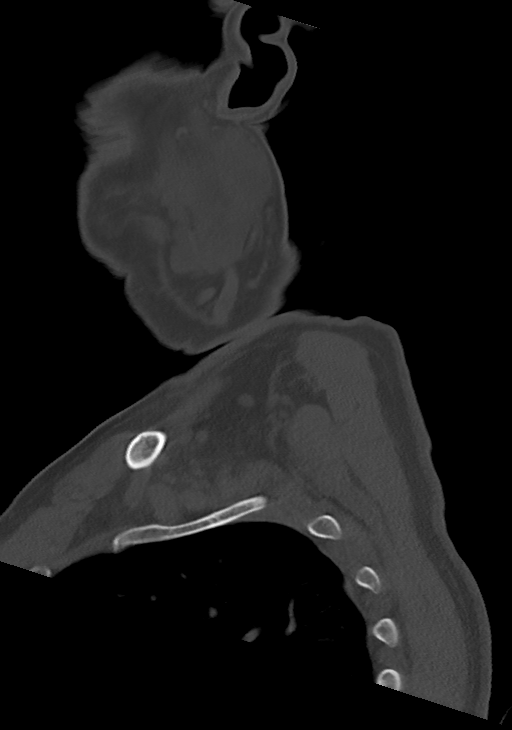
[im 86/172  soft-tissue]
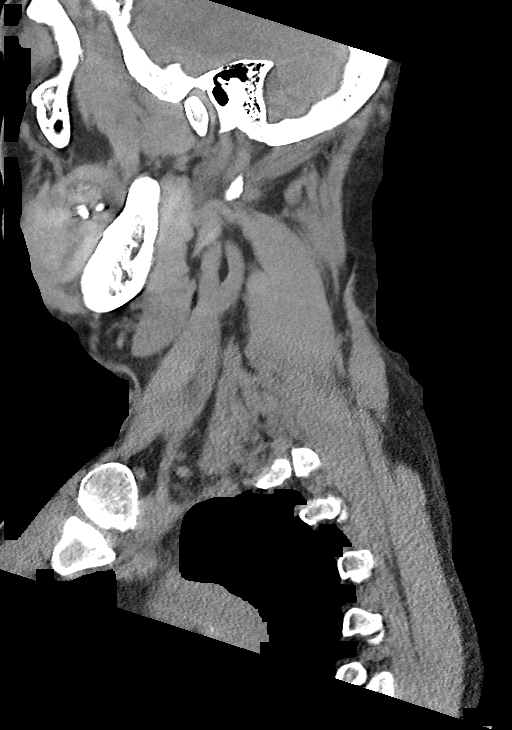
[im 86/172  bone]
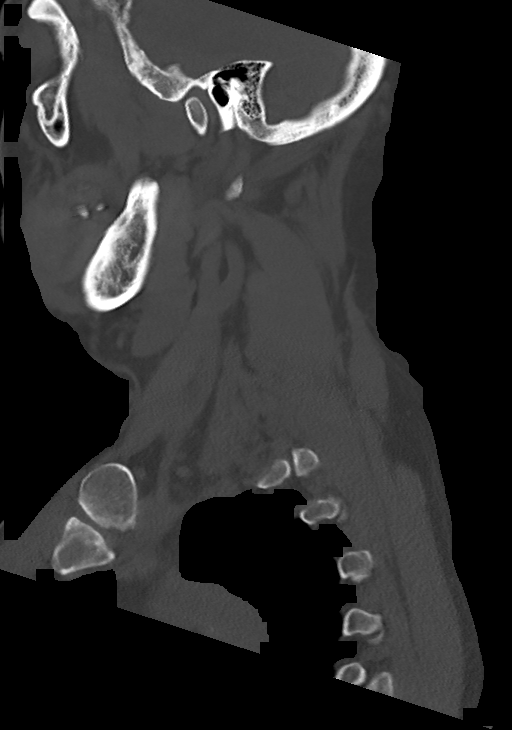
[im 100/172  bone]
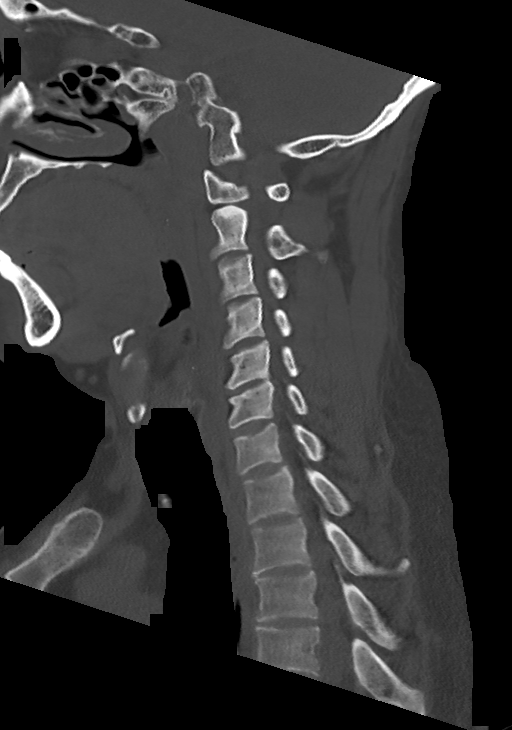
[im 115/172  bone]
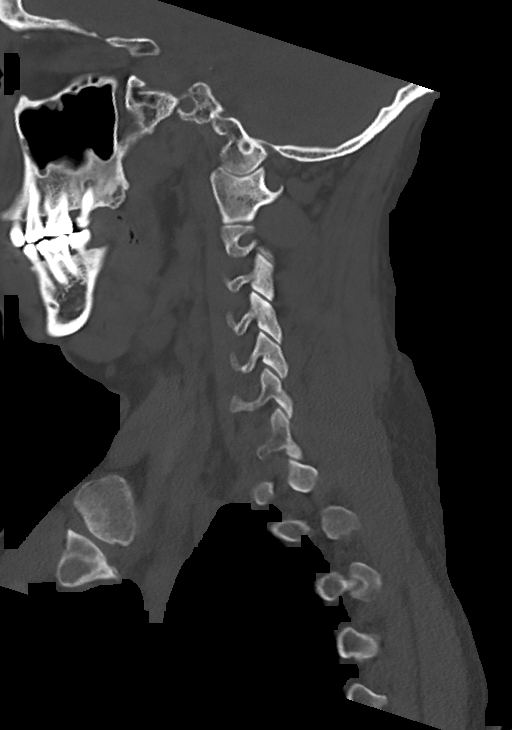

[Series 8: cor bone · coronal · 0.61mm/px · 3 of 150 slices shown]
[im 30/150  bone]
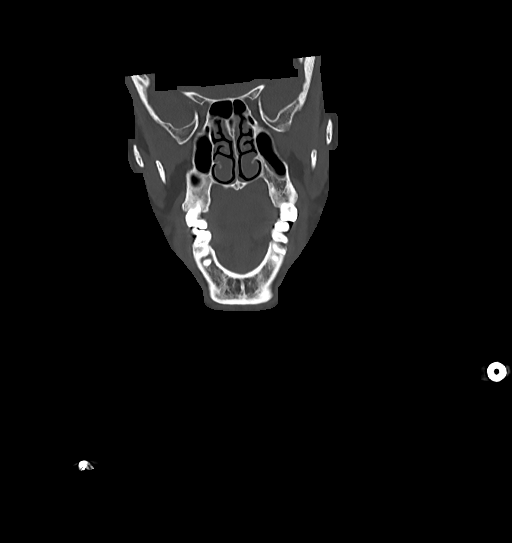
[im 60/150  bone]
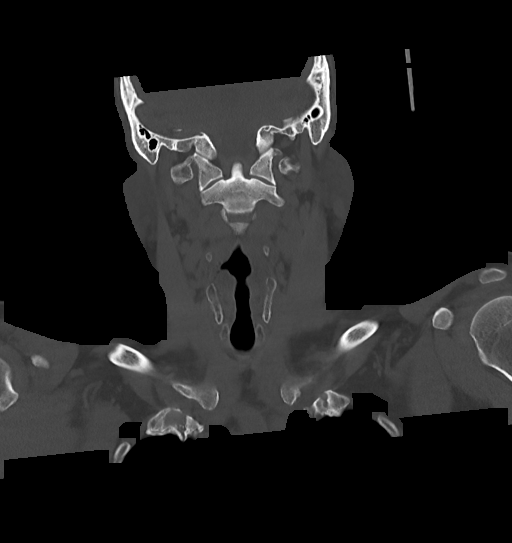
[im 90/150  bone]
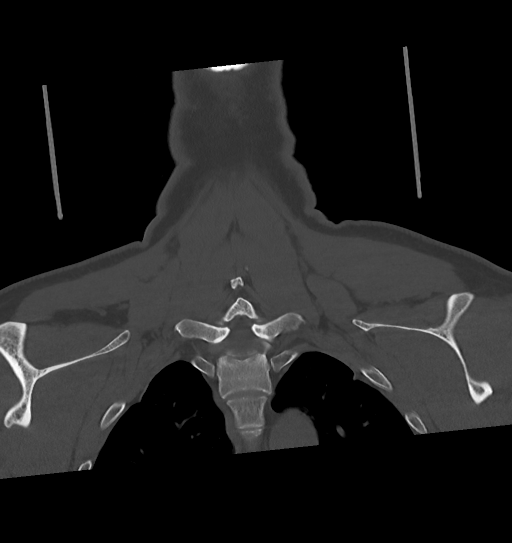

[13 of 35 positions shown; findings below may reference images not displayed]

Multidetector CT imaging of the cervical spine was performed without
intravenous contrast. Multiplanar CT image reconstructions were also
generated.

RADIATION DOSE REDUCTION: This exam was performed according to the
departmental dose-optimization program which includes automated
exposure control, adjustment of the mA and/or kV according to
patient size and/or use of iterative reconstruction technique.
FINDINGS: CT MAXILLOFACIAL FINDINGS

Osseous: No fracture or mandibular dislocation. No destructive
process.

Orbits: Negative. No traumatic or inflammatory finding.

Sinuses: Clear.

Soft tissues: Negative.

Limited intracranial: No significant or unexpected finding.

CT CERVICAL FINDINGS

Alignment: Normal.

Skull base and vertebrae: No acute fracture. No primary bone lesion
or focal pathologic process.

Soft tissues and spinal canal: No prevertebral fluid or swelling. No
visible canal hematoma.

Disc levels: Disc spaces are well preserved. Minor disc bulging at
C5-C6. No evidence of a disc herniation. No stenosis.

Upper chest: Negative.

Other: None.
IMPRESSION: MAXILLOFACIAL CT

1. Negative.  No fracture.

CERVICAL CT

1. No fracture or acute finding.

## 2024-05-27 ENCOUNTER — Encounter: Payer: Self-pay | Admitting: Neurosurgery
